# Patient Record
Sex: Male | Born: 1955 | Race: White | Hispanic: No | Marital: Married | Smoking: Never smoker
Health system: Southern US, Community
[De-identification: ages and names within clinical notes are randomized; demographics above are authoritative.]

## PROBLEM LIST (undated history)

## (undated) DIAGNOSIS — F32A Depression, unspecified: Secondary | ICD-10-CM

## (undated) DIAGNOSIS — K219 Gastro-esophageal reflux disease without esophagitis: Secondary | ICD-10-CM

## (undated) DIAGNOSIS — G20 Parkinson's disease: Secondary | ICD-10-CM

## (undated) DIAGNOSIS — G4700 Insomnia, unspecified: Secondary | ICD-10-CM

## (undated) DIAGNOSIS — I739 Peripheral vascular disease, unspecified: Secondary | ICD-10-CM

## (undated) DIAGNOSIS — N400 Enlarged prostate without lower urinary tract symptoms: Secondary | ICD-10-CM

## (undated) DIAGNOSIS — M1990 Unspecified osteoarthritis, unspecified site: Secondary | ICD-10-CM

## (undated) DIAGNOSIS — F419 Anxiety disorder, unspecified: Secondary | ICD-10-CM

## (undated) DIAGNOSIS — G4730 Sleep apnea, unspecified: Secondary | ICD-10-CM

## (undated) DIAGNOSIS — I82401 Acute embolism and thrombosis of unspecified deep veins of right lower extremity: Secondary | ICD-10-CM

## (undated) HISTORY — PX: NASAL SINUS SURGERY: SHX719

## (undated) HISTORY — PX: OTHER SURGICAL HISTORY: SHX169

---

## 2002-10-26 HISTORY — PX: OTHER SURGICAL HISTORY: SHX169

## 2007-10-27 DIAGNOSIS — I82401 Acute embolism and thrombosis of unspecified deep veins of right lower extremity: Secondary | ICD-10-CM

## 2007-10-27 HISTORY — DX: Acute embolism and thrombosis of unspecified deep veins of right lower extremity: I82.401

## 2009-10-26 HISTORY — PX: OTHER SURGICAL HISTORY: SHX169

## 2009-11-26 DIAGNOSIS — G20A1 Parkinson's disease without dyskinesia, without mention of fluctuations: Secondary | ICD-10-CM

## 2009-11-26 DIAGNOSIS — G20 Parkinson's disease: Secondary | ICD-10-CM

## 2009-11-26 HISTORY — DX: Parkinson's disease without dyskinesia, without mention of fluctuations: G20.A1

## 2009-11-26 HISTORY — DX: Parkinson's disease: G20

## 2010-08-29 ENCOUNTER — Inpatient Hospital Stay (HOSPITAL_COMMUNITY): Admission: RE | Admit: 2010-08-29 | Discharge: 2010-09-02 | Payer: Self-pay | Admitting: Orthopaedic Surgery

## 2010-10-26 HISTORY — PX: OTHER SURGICAL HISTORY: SHX169

## 2011-01-06 LAB — CBC
HCT: 22.8 % — ABNORMAL LOW (ref 39.0–52.0)
HCT: 25.8 % — ABNORMAL LOW (ref 39.0–52.0)
HCT: 44 % (ref 39.0–52.0)
Hemoglobin: 15.6 g/dL (ref 13.0–17.0)
Hemoglobin: 7.8 g/dL — ABNORMAL LOW (ref 13.0–17.0)
Hemoglobin: 8.9 g/dL — ABNORMAL LOW (ref 13.0–17.0)
MCH: 30.7 pg (ref 26.0–34.0)
MCH: 30.7 pg (ref 26.0–34.0)
MCH: 31 pg (ref 26.0–34.0)
MCH: 32 pg (ref 26.0–34.0)
MCHC: 34.2 g/dL (ref 30.0–36.0)
MCHC: 34.4 g/dL (ref 30.0–36.0)
MCHC: 34.4 g/dL (ref 30.0–36.0)
MCHC: 35.4 g/dL (ref 30.0–36.0)
MCV: 89.3 fL (ref 78.0–100.0)
MCV: 89.4 fL (ref 78.0–100.0)
MCV: 90.4 fL (ref 78.0–100.0)
MCV: 91.2 fL (ref 78.0–100.0)
Platelets: 130 10*3/uL — ABNORMAL LOW (ref 150–400)
Platelets: 173 10*3/uL (ref 150–400)
Platelets: 192 10*3/uL (ref 150–400)
Platelets: 237 10*3/uL (ref 150–400)
RBC: 2.55 MIL/uL — ABNORMAL LOW (ref 4.22–5.81)
RBC: 2.91 MIL/uL — ABNORMAL LOW (ref 4.22–5.81)
RBC: 4.87 MIL/uL (ref 4.22–5.81)
RDW: 12.4 % (ref 11.5–15.5)
RDW: 13.6 % (ref 11.5–15.5)
RDW: 13.7 % (ref 11.5–15.5)
WBC: 11.4 10*3/uL — ABNORMAL HIGH (ref 4.0–10.5)
WBC: 8.8 10*3/uL (ref 4.0–10.5)
WBC: 9.4 10*3/uL (ref 4.0–10.5)

## 2011-01-06 LAB — TYPE AND SCREEN
ABO/RH(D): O POS
Antibody Screen: NEGATIVE
Unit division: 0
Unit division: 0
Unit division: 0
Unit division: 0
Unit division: 0
Unit division: 0

## 2011-01-06 LAB — URINE MICROSCOPIC-ADD ON

## 2011-01-06 LAB — BASIC METABOLIC PANEL WITH GFR
BUN: 11 mg/dL (ref 6–23)
BUN: 12 mg/dL (ref 6–23)
BUN: 7 mg/dL (ref 6–23)
BUN: 9 mg/dL (ref 6–23)
CO2: 27 meq/L (ref 19–32)
CO2: 27 meq/L (ref 19–32)
CO2: 27 meq/L (ref 19–32)
CO2: 27 meq/L (ref 19–32)
Calcium: 7.3 mg/dL — ABNORMAL LOW (ref 8.4–10.5)
Calcium: 7.5 mg/dL — ABNORMAL LOW (ref 8.4–10.5)
Calcium: 7.6 mg/dL — ABNORMAL LOW (ref 8.4–10.5)
Calcium: 9.5 mg/dL (ref 8.4–10.5)
Chloride: 104 meq/L (ref 96–112)
Chloride: 104 meq/L (ref 96–112)
Chloride: 104 meq/L (ref 96–112)
Chloride: 106 meq/L (ref 96–112)
Creatinine, Ser: 0.84 mg/dL (ref 0.4–1.5)
Creatinine, Ser: 0.85 mg/dL (ref 0.4–1.5)
Creatinine, Ser: 0.91 mg/dL (ref 0.4–1.5)
Creatinine, Ser: 0.94 mg/dL (ref 0.4–1.5)
GFR calc non Af Amer: >60 mL/min
GFR calc non Af Amer: >60 mL/min
GFR calc non Af Amer: >60 mL/min
GFR calc non Af Amer: >60 mL/min
Glucose, Bld: 124 mg/dL — ABNORMAL HIGH (ref 70–99)
Glucose, Bld: 129 mg/dL — ABNORMAL HIGH (ref 70–99)
Glucose, Bld: 166 mg/dL — ABNORMAL HIGH (ref 70–99)
Glucose, Bld: 96 mg/dL (ref 70–99)
Potassium: 3.8 meq/L (ref 3.5–5.1)
Potassium: 3.8 meq/L (ref 3.5–5.1)
Potassium: 3.9 meq/L (ref 3.5–5.1)
Potassium: 4.7 meq/L (ref 3.5–5.1)
Sodium: 136 meq/L (ref 135–145)
Sodium: 136 meq/L (ref 135–145)
Sodium: 137 meq/L (ref 135–145)
Sodium: 141 meq/L (ref 135–145)

## 2011-01-06 LAB — SURGICAL PCR SCREEN
MRSA, PCR: NEGATIVE
Staphylococcus aureus: POSITIVE — AB

## 2011-01-06 LAB — PROTIME-INR
INR: 1.09 (ref 0.00–1.49)
INR: 1.35 (ref 0.00–1.49)
INR: 1.4 (ref 0.00–1.49)
INR: 1.73 — ABNORMAL HIGH (ref 0.00–1.49)
INR: 2.18 — ABNORMAL HIGH (ref 0.00–1.49)
Prothrombin Time: 16.9 s — ABNORMAL HIGH (ref 11.6–15.2)
Prothrombin Time: 17.4 seconds — ABNORMAL HIGH (ref 11.6–15.2)
Prothrombin Time: 20.4 seconds — ABNORMAL HIGH (ref 11.6–15.2)
Prothrombin Time: 24.4 s — ABNORMAL HIGH (ref 11.6–15.2)

## 2011-01-06 LAB — POCT I-STAT 4, (NA,K, GLUC, HGB,HCT)
Glucose, Bld: 116 mg/dL — ABNORMAL HIGH (ref 70–99)
HCT: 20 % — ABNORMAL LOW (ref 39.0–52.0)
HCT: 34 % — ABNORMAL LOW (ref 39.0–52.0)
Hemoglobin: 11.6 g/dL — ABNORMAL LOW (ref 13.0–17.0)
Hemoglobin: 6.8 g/dL — CL (ref 13.0–17.0)
Potassium: 4.3 mEq/L (ref 3.5–5.1)
Potassium: 4.5 meq/L (ref 3.5–5.1)
Sodium: 140 mEq/L (ref 135–145)
Sodium: 140 meq/L (ref 135–145)

## 2011-01-06 LAB — PREPARE RBC (CROSSMATCH)

## 2011-01-06 LAB — FERRITIN: Ferritin: 143 ng/mL (ref 22–322)

## 2011-01-06 LAB — URINALYSIS, ROUTINE W REFLEX MICROSCOPIC
Glucose, UA: NEGATIVE mg/dL
Hgb urine dipstick: NEGATIVE
Protein, ur: 30 mg/dL — AB

## 2011-01-06 LAB — IRON AND TIBC

## 2011-01-06 LAB — VITAMIN B12: Vitamin B-12: 245 pg/mL (ref 211–911)

## 2011-01-06 LAB — FOLATE: Folate: 12.6 ng/mL

## 2011-02-11 ENCOUNTER — Other Ambulatory Visit: Payer: Self-pay | Admitting: Orthopaedic Surgery

## 2011-02-11 ENCOUNTER — Encounter (HOSPITAL_COMMUNITY): Payer: PRIVATE HEALTH INSURANCE

## 2011-02-11 DIAGNOSIS — Z01812 Encounter for preprocedural laboratory examination: Secondary | ICD-10-CM | POA: Insufficient documentation

## 2011-02-11 DIAGNOSIS — G4730 Sleep apnea, unspecified: Secondary | ICD-10-CM | POA: Insufficient documentation

## 2011-02-11 DIAGNOSIS — M9420 Chondromalacia, unspecified site: Secondary | ICD-10-CM | POA: Insufficient documentation

## 2011-02-11 DIAGNOSIS — IMO0002 Reserved for concepts with insufficient information to code with codable children: Secondary | ICD-10-CM | POA: Insufficient documentation

## 2011-02-11 DIAGNOSIS — Z86718 Personal history of other venous thrombosis and embolism: Secondary | ICD-10-CM | POA: Insufficient documentation

## 2011-02-11 DIAGNOSIS — G20A1 Parkinson's disease without dyskinesia, without mention of fluctuations: Secondary | ICD-10-CM | POA: Insufficient documentation

## 2011-02-11 DIAGNOSIS — G20 Parkinson's disease: Secondary | ICD-10-CM | POA: Insufficient documentation

## 2011-02-11 DIAGNOSIS — X58XXXA Exposure to other specified factors, initial encounter: Secondary | ICD-10-CM | POA: Insufficient documentation

## 2011-02-11 LAB — CBC
Platelets: 229 10*3/uL (ref 150–400)
RBC: 5.01 MIL/uL (ref 4.22–5.81)
RDW: 13 % (ref 11.5–15.5)
WBC: 6.8 10*3/uL (ref 4.0–10.5)

## 2011-02-11 LAB — PROTIME-INR
INR: 1.08 (ref 0.00–1.49)
Prothrombin Time: 14.2 seconds (ref 11.6–15.2)

## 2011-02-11 LAB — BASIC METABOLIC PANEL
BUN: 21 mg/dL (ref 6–23)
Chloride: 107 mEq/L (ref 96–112)
GFR calc Af Amer: >60 mL/min (ref >60)
GFR calc non Af Amer: >60 mL/min (ref >60)
Potassium: 4.3 mEq/L (ref 3.5–5.1)

## 2011-02-11 LAB — SURGICAL PCR SCREEN: MRSA, PCR: NEGATIVE

## 2011-02-12 ENCOUNTER — Ambulatory Visit (HOSPITAL_COMMUNITY)
Admission: RE | Admit: 2011-02-12 | Discharge: 2011-02-12 | Disposition: A | Discharge status: Home / Self Care | Payer: PRIVATE HEALTH INSURANCE | Source: Ambulatory Visit | Attending: Orthopaedic Surgery | Admitting: Orthopaedic Surgery

## 2011-02-12 DIAGNOSIS — G4733 Obstructive sleep apnea (adult) (pediatric): Secondary | ICD-10-CM | POA: Insufficient documentation

## 2011-02-12 DIAGNOSIS — G20 Parkinson's disease: Secondary | ICD-10-CM | POA: Insufficient documentation

## 2011-02-12 DIAGNOSIS — Z86718 Personal history of other venous thrombosis and embolism: Secondary | ICD-10-CM | POA: Insufficient documentation

## 2011-02-12 DIAGNOSIS — Z96649 Presence of unspecified artificial hip joint: Secondary | ICD-10-CM | POA: Insufficient documentation

## 2011-02-12 DIAGNOSIS — M23305 Other meniscus derangements, unspecified medial meniscus, unspecified knee: Secondary | ICD-10-CM | POA: Insufficient documentation

## 2011-02-12 DIAGNOSIS — E669 Obesity, unspecified: Secondary | ICD-10-CM | POA: Insufficient documentation

## 2011-02-12 DIAGNOSIS — G20A1 Parkinson's disease without dyskinesia, without mention of fluctuations: Secondary | ICD-10-CM | POA: Insufficient documentation

## 2011-02-12 DIAGNOSIS — M9420 Chondromalacia, unspecified site: Secondary | ICD-10-CM | POA: Insufficient documentation

## 2011-02-12 DIAGNOSIS — M6750 Plica syndrome, unspecified knee: Secondary | ICD-10-CM | POA: Insufficient documentation

## 2011-02-16 NOTE — H&P (Signed)
  NAME:  Sean Tyler, Sean Tyler                 ACCOUNT NO.:  [id number]  MEDICAL RECORD NO.:  [id number]           PATIENT TYPE:  O  LOCATION:  PADM                         FACILITY:  [facility]  PHYSICIAN:  Vanita Panda. Magnus Ivan, M.D.DATE OF BIRTH:  1955/12/26  DATE OF ADMISSION:  02/11/2011 DATE OF DISCHARGE:                             HISTORY & PHYSICAL   CHIEF COMPLAINT:  Left knee pain.  HISTORY OF PRESENT ILLNESS:  Sean Tyler is a 55 year old gentleman who has had previous arthroscopic surgery at [facility] on his left knee.  He has been having worsening pain in his knee and we obtained an MRI in March 2012 and it showed retear of his residual meniscal tissue medially from the joint.  He has moderate joint effusion and significant synovial proliferation.  There is a medial plica formation as well.  Due to the continued pain he is having in his knee, we recommended he undergo arthroscopic intervention.  He wishes to proceed with this instead of a total knee replacement.  He has had a total hip replacement that went well.  PAST MEDICAL HISTORY: 1. Parkinson disease. 2. History of DVT. 3. Borderline diabetic. 4. Sleep apnea. 5. Anxiety. 6. Arthritis.  ALLERGIES: 1. CODEINE. 2. BACTRIM.  FAMILY MEDICAL HISTORY:  Lymphoma, high blood pressure.  SOCIAL HISTORY:  He is married.  He is a Investment banker, corporate.  He does not smoke and drinks alcohol on occasion.  REVIEW OF SYSTEMS:  Negative for chest pain, shortness of breath, fever, chills, nausea, and vomiting.  PHYSICAL EXAMINATION:  VITAL SIGNS:  He is afebrile with stable vital signs. GENERAL:  He is alert and oriented x3, in no acute distress or obvious discomfort. HEENT:  Normocephalic, atraumatic.  Pupils equal, round, and reactive to light.  Extraocular muscles intact. NECK:  Supple. LUNGS:  Clear to auscultation bilaterally. HEART:  Regular rate and rhythm. ABDOMEN:  Benign. EXTREMITIES:  Left knee shows a mild effusion with  medial joint line tenderness and a positive McMurray sign. NEURO:  He is neurovascularly intact.  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  PLAN:  We will proceed for an arthroscopic intervention of the left knee today.  He understands the risks and benefits of the surgery and what this will involve.     Vanita Panda. Magnus Ivan, M.D.     CYB/MEDQ  D:  02/12/2011  T:  02/12/2011  Job:  [id number]  Electronically Signed by Doneen Poisson M.D. on 02/16/2011 10:55:42 PM

## 2011-02-16 NOTE — Op Note (Signed)
NAME:  Sean Tyler, Sean Tyler                 ACCOUNT NO.:  [id number]  MEDICAL RECORD NO.:  [id number]           PATIENT TYPE:  O  LOCATION:  PADM                         FACILITY:  [facility]  PHYSICIAN:  Vanita Panda. Magnus Ivan, M.D.DATE OF BIRTH:  1956/05/06  DATE OF PROCEDURE:  02/12/2011 DATE OF DISCHARGE:                              OPERATIVE REPORT   PREOPERATIVE DIAGNOSES:  Left knee residual torn medial meniscus with medial plica and grade 3 chondromalacia medial compartment.  POSTOPERATIVE DIAGNOSES: 1. Right knee midbody medial meniscal tear. 2. Grade 4 chondromalacia medial compartment. 3. Large medial plica.  PROCEDURE:  Left knee arthroscopy with debridement medial plica excision and partial medial meniscectomy.  SURGEON:  Vanita Panda. Magnus Ivan, M.D.  ANESTHESIA: 1. General. 2. Local.  BLOOD LOSS:  Minimal.  COMPLICATIONS:  None.  INDICATION:  Mr. Tetrault is a 55 year old gentleman who had previous arthroscopic intervention of his left knee several years ago in [facility].  He has subsequently had a right total hip replacement and his left knee is really bothering him quite a bit.  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  X-rays also showed that he had still joint space remaining.  He wished to proceed with a second arthroscopic intervention given the worsening problems with his knee.  The risks and benefits of this were explained to him in detail and he did wish to proceed with surgery.  DESCRIPTION OF PROCEDURE:  After informed consent was obtained, appropriate left knee was marked.  He was brought to the operating room, placed in supine on the operating table, general anesthesia was then obtained.  His left leg was prepped and draped from the thigh  down to the ankle with DuraPrep and sterile drapes including a sterile stockinette.  The lateral leg post was utilized and the bed was raised and the knee was flexed off the table.  Time-out was called and we did identify the correct patient and correct left knee.  I then made an anterolateral arthroscopy portal just lateral to patella tendon.  A laparoscopic cannula was inserted and a large effusion was drained from the knee.  I went to the medial compartment and made a medial incision and placed an arthroscopic shaver.  I could see there was a mid body tear of the meniscus, now I was able to debride this back to a stable margin performing a partial medial meniscectomy.  We could see there were areas exposed bone directly on the tibial plateau on the medial side.  The ACL and PCL were intact.  The lateral side showed at least grade 2 chondromalacia.  The patellofemoral joint was assessed and a significant large medial plica as well arthritic changes underneath the patella and at the trochlear groove.  I used a shaver to remove the large medial plica as well.  I then debrided some cartilage from the trochlear groove as well.  All instrumentations were then removed and I allowed the fluid to drain from the knee.  I then closed the portal sites with interrupted nylon suture.  I infiltrated the knee with mixture of morphine and Marcaine as well as portal sites.  Xeroform followed by well-padded sterile dressing was applied.  The patient was awakened, extubated, and taken to recovery room  in stable condition.  All final counts were correct and there were no complications noted.     Vanita Panda. Magnus Ivan, M.D.     CYB/MEDQ  D:  02/12/2011  T:  02/12/2011  Job:  [id number]  Electronically Signed by Doneen Poisson M.D. on 02/16/2011 10:55:43 PM

## 2012-06-13 ENCOUNTER — Other Ambulatory Visit (HOSPITAL_COMMUNITY): Payer: Self-pay | Admitting: Orthopaedic Surgery

## 2012-06-14 ENCOUNTER — Encounter (HOSPITAL_COMMUNITY): Payer: Self-pay | Admitting: Pharmacy Technician

## 2012-06-14 ENCOUNTER — Encounter (HOSPITAL_COMMUNITY): Payer: Self-pay | Admitting: *Deleted

## 2012-06-16 ENCOUNTER — Encounter (HOSPITAL_COMMUNITY): Payer: Self-pay | Admitting: Certified Registered Nurse Anesthetist

## 2012-06-16 ENCOUNTER — Encounter (HOSPITAL_COMMUNITY): Payer: Self-pay | Admitting: *Deleted

## 2012-06-16 ENCOUNTER — Ambulatory Visit (HOSPITAL_COMMUNITY): Payer: BC Managed Care – PPO

## 2012-06-16 ENCOUNTER — Ambulatory Visit (HOSPITAL_COMMUNITY): Payer: BC Managed Care – PPO | Admitting: Certified Registered Nurse Anesthetist

## 2012-06-16 ENCOUNTER — Inpatient Hospital Stay (HOSPITAL_COMMUNITY)
Admission: RE | Admit: 2012-06-16 | Discharge: 2012-06-20 | DRG: 209 | Disposition: A | Discharge status: Home Health | Payer: BC Managed Care – PPO | Source: Ambulatory Visit | Attending: Orthopaedic Surgery | Admitting: Orthopaedic Surgery

## 2012-06-16 ENCOUNTER — Encounter (HOSPITAL_COMMUNITY): Payer: Self-pay

## 2012-06-16 ENCOUNTER — Encounter (HOSPITAL_COMMUNITY)
Admission: RE | Disposition: A | Discharge status: Home Health | Payer: Self-pay | Source: Ambulatory Visit | Attending: Orthopaedic Surgery

## 2012-06-16 DIAGNOSIS — Z86718 Personal history of other venous thrombosis and embolism: Secondary | ICD-10-CM

## 2012-06-16 DIAGNOSIS — G4730 Sleep apnea, unspecified: Secondary | ICD-10-CM | POA: Diagnosis present

## 2012-06-16 DIAGNOSIS — Z96649 Presence of unspecified artificial hip joint: Secondary | ICD-10-CM

## 2012-06-16 DIAGNOSIS — M1712 Unilateral primary osteoarthritis, left knee: Secondary | ICD-10-CM

## 2012-06-16 DIAGNOSIS — M1710 Unilateral primary osteoarthritis, unspecified knee: Principal | ICD-10-CM | POA: Diagnosis present

## 2012-06-16 DIAGNOSIS — K219 Gastro-esophageal reflux disease without esophagitis: Secondary | ICD-10-CM | POA: Diagnosis present

## 2012-06-16 DIAGNOSIS — G20 Parkinson's disease: Secondary | ICD-10-CM | POA: Diagnosis present

## 2012-06-16 DIAGNOSIS — G20A1 Parkinson's disease without dyskinesia, without mention of fluctuations: Secondary | ICD-10-CM | POA: Diagnosis present

## 2012-06-16 DIAGNOSIS — N400 Enlarged prostate without lower urinary tract symptoms: Secondary | ICD-10-CM | POA: Diagnosis present

## 2012-06-16 DIAGNOSIS — Z79899 Other long term (current) drug therapy: Secondary | ICD-10-CM

## 2012-06-16 HISTORY — DX: Acute embolism and thrombosis of unspecified deep veins of right lower extremity: I82.401

## 2012-06-16 HISTORY — DX: Benign prostatic hyperplasia without lower urinary tract symptoms: N40.0

## 2012-06-16 HISTORY — DX: Unspecified osteoarthritis, unspecified site: M19.90

## 2012-06-16 HISTORY — PX: TOTAL KNEE ARTHROPLASTY: SHX125

## 2012-06-16 HISTORY — DX: Gastro-esophageal reflux disease without esophagitis: K21.9

## 2012-06-16 HISTORY — DX: Parkinson's disease: G20

## 2012-06-16 HISTORY — DX: Sleep apnea, unspecified: G47.30

## 2012-06-16 HISTORY — DX: Insomnia, unspecified: G47.00

## 2012-06-16 LAB — CBC
MCH: 31.3 pg (ref 26.0–34.0)
MCV: 89 fL (ref 78.0–100.0)
Platelets: 221 10*3/uL (ref 150–400)
RDW: 12.8 % (ref 11.5–15.5)

## 2012-06-16 LAB — BASIC METABOLIC PANEL
BUN: 20 mg/dL (ref 6–23)
CO2: 24 mEq/L (ref 19–32)
Calcium: 9.1 mg/dL (ref 8.4–10.5)
Creatinine, Ser: 0.84 mg/dL (ref 0.50–1.35)
Glucose, Bld: 107 mg/dL — ABNORMAL HIGH (ref 70–99)

## 2012-06-16 LAB — PROTIME-INR: Prothrombin Time: 13.6 seconds (ref 11.6–15.2)

## 2012-06-16 LAB — URINALYSIS, ROUTINE W REFLEX MICROSCOPIC
Bilirubin Urine: NEGATIVE
Nitrite: NEGATIVE
Urobilinogen, UA: 0.2 mg/dL (ref 0.0–1.0)
pH: 5.5 (ref 5.0–8.0)

## 2012-06-16 LAB — TYPE AND SCREEN
ABO/RH(D): O POS
Antibody Screen: NEGATIVE

## 2012-06-16 SURGERY — ARTHROPLASTY, KNEE, TOTAL
Anesthesia: General | Site: Knee | Laterality: Left | Wound class: Clean

## 2012-06-16 MED ORDER — METHOCARBAMOL 500 MG PO TABS
500.0000 mg | ORAL_TABLET | Freq: Four times a day (QID) | ORAL | Status: DC | PRN
  Administered 2012-06-18 – 2012-06-19 (×5): 500 mg via ORAL
  Filled 2012-06-16 (×6): qty 1

## 2012-06-16 MED ORDER — ACETAMINOPHEN 325 MG PO TABS: 650.0000 mg | ORAL_TABLET | Freq: Four times a day (QID) | ORAL | Status: DC | PRN

## 2012-06-16 MED ORDER — MEPERIDINE HCL 50 MG/ML IJ SOLN: 6.2500 mg | INTRAMUSCULAR | Status: DC | PRN

## 2012-06-16 MED ORDER — ACETAMINOPHEN 10 MG/ML IV SOLN
INTRAVENOUS | Status: AC
  Filled 2012-06-16: qty 100

## 2012-06-16 MED ORDER — ZOLPIDEM TARTRATE 5 MG PO TABS: 5.0000 mg | ORAL_TABLET | Freq: Every evening | ORAL | Status: DC | PRN

## 2012-06-16 MED ORDER — METOCLOPRAMIDE HCL 10 MG PO TABS: 5.0000 mg | ORAL_TABLET | Freq: Three times a day (TID) | ORAL | Status: DC | PRN

## 2012-06-16 MED ORDER — ONDANSETRON HCL 4 MG/2ML IJ SOLN: 4.0000 mg | Freq: Four times a day (QID) | INTRAMUSCULAR | Status: DC | PRN

## 2012-06-16 MED ORDER — GLYCOPYRROLATE 0.2 MG/ML IJ SOLN
INTRAMUSCULAR | Status: DC | PRN
  Administered 2012-06-16: 0.6 mg via INTRAVENOUS

## 2012-06-16 MED ORDER — MENTHOL 3 MG MT LOZG
1.0000 | LOZENGE | OROMUCOSAL | Status: DC | PRN
  Filled 2012-06-16: qty 9

## 2012-06-16 MED ORDER — MIDAZOLAM HCL 5 MG/5ML IJ SOLN
INTRAMUSCULAR | Status: DC | PRN
  Administered 2012-06-16 (×2): 2 mg via INTRAVENOUS

## 2012-06-16 MED ORDER — HYDROMORPHONE HCL PF 1 MG/ML IJ SOLN
INTRAMUSCULAR | Status: AC
  Filled 2012-06-16: qty 1

## 2012-06-16 MED ORDER — LACTATED RINGERS IV SOLN
INTRAVENOUS | Status: DC | PRN
  Administered 2012-06-16 (×2): via INTRAVENOUS

## 2012-06-16 MED ORDER — ACETAMINOPHEN 650 MG RE SUPP: 650.0000 mg | Freq: Four times a day (QID) | RECTAL | Status: DC | PRN

## 2012-06-16 MED ORDER — DOCUSATE SODIUM 100 MG PO CAPS
100.0000 mg | ORAL_CAPSULE | Freq: Two times a day (BID) | ORAL | Status: DC
  Administered 2012-06-16 – 2012-06-20 (×8): 100 mg via ORAL

## 2012-06-16 MED ORDER — HYDROMORPHONE 0.3 MG/ML IV SOLN
INTRAVENOUS | Status: DC
  Administered 2012-06-16: 0.3 mg via INTRAVENOUS
  Administered 2012-06-16: 5.1 mg via INTRAVENOUS
  Administered 2012-06-17: 2.12 mg via INTRAVENOUS
  Administered 2012-06-17: 2.1 mg via INTRAVENOUS
  Administered 2012-06-17: 1.5 mg via INTRAVENOUS
  Administered 2012-06-17: 2.1 mg via INTRAVENOUS
  Administered 2012-06-17: 1.7 mg via INTRAVENOUS
  Administered 2012-06-17: 2.33 mg via INTRAVENOUS
  Administered 2012-06-17: 0.3 mg via INTRAVENOUS
  Administered 2012-06-17: 15:00:00 via INTRAVENOUS
  Administered 2012-06-18: 1.8 mg via INTRAVENOUS
  Administered 2012-06-18: 1.2 mg via INTRAVENOUS
  Administered 2012-06-18: 1.75 mg via INTRAVENOUS
  Filled 2012-06-16 (×2): qty 25

## 2012-06-16 MED ORDER — LACTATED RINGERS IV SOLN
INTRAVENOUS | Status: DC
Start: 2012-06-16 — End: 2012-06-16

## 2012-06-16 MED ORDER — ONDANSETRON HCL 4 MG/2ML IJ SOLN
INTRAMUSCULAR | Status: DC | PRN
  Administered 2012-06-16: 4 mg via INTRAVENOUS

## 2012-06-16 MED ORDER — VANCOMYCIN HCL IN DEXTROSE 1-5 GM/200ML-% IV SOLN
INTRAVENOUS | Status: AC
  Filled 2012-06-16: qty 200

## 2012-06-16 MED ORDER — CLONAZEPAM 1 MG PO TABS
1.0000 mg | ORAL_TABLET | Freq: Two times a day (BID) | ORAL | Status: DC
  Filled 2012-06-16 (×3): qty 1

## 2012-06-16 MED ORDER — SODIUM CHLORIDE 0.9 % IJ SOLN: 9.0000 mL | INTRAMUSCULAR | Status: DC | PRN

## 2012-06-16 MED ORDER — DEXTROSE 5 % IV SOLN: 3.0000 g | INTRAVENOUS | Status: DC

## 2012-06-16 MED ORDER — DIPHENHYDRAMINE HCL 12.5 MG/5ML PO ELIX: 12.5000 mg | ORAL_SOLUTION | ORAL | Status: DC | PRN

## 2012-06-16 MED ORDER — LIDOCAINE HCL (CARDIAC) 20 MG/ML IV SOLN
INTRAVENOUS | Status: DC | PRN
  Administered 2012-06-16: 100 mg via INTRAVENOUS

## 2012-06-16 MED ORDER — NEOSTIGMINE METHYLSULFATE 1 MG/ML IJ SOLN
INTRAMUSCULAR | Status: DC | PRN
  Administered 2012-06-16: 5 mg via INTRAVENOUS

## 2012-06-16 MED ORDER — PROPOFOL 10 MG/ML IV EMUL
INTRAVENOUS | Status: DC | PRN
  Administered 2012-06-16: 200 mg via INTRAVENOUS

## 2012-06-16 MED ORDER — SODIUM CHLORIDE 0.9 % IR SOLN
Status: DC | PRN
  Administered 2012-06-16: 3000 mL

## 2012-06-16 MED ORDER — NALOXONE HCL 0.4 MG/ML IJ SOLN: 0.4000 mg | INTRAMUSCULAR | Status: DC | PRN

## 2012-06-16 MED ORDER — DIPHENHYDRAMINE HCL 12.5 MG/5ML PO ELIX: 12.5000 mg | ORAL_SOLUTION | Freq: Four times a day (QID) | ORAL | Status: DC | PRN

## 2012-06-16 MED ORDER — PROMETHAZINE HCL 25 MG/ML IJ SOLN: 6.2500 mg | INTRAMUSCULAR | Status: DC | PRN

## 2012-06-16 MED ORDER — ONDANSETRON HCL 4 MG PO TABS: 4.0000 mg | ORAL_TABLET | Freq: Four times a day (QID) | ORAL | Status: DC | PRN

## 2012-06-16 MED ORDER — CARBIDOPA-LEVODOPA 25-100 MG PO TABS
2.0000 | ORAL_TABLET | Freq: Three times a day (TID) | ORAL | Status: DC
  Administered 2012-06-16 – 2012-06-18 (×8): 2 via ORAL
  Filled 2012-06-16 (×15): qty 2

## 2012-06-16 MED ORDER — SODIUM CHLORIDE 0.9 % IV SOLN
INTRAVENOUS | Status: AC
  Filled 2012-06-16: qty 100

## 2012-06-16 MED ORDER — HYDROMORPHONE HCL PF 1 MG/ML IJ SOLN
INTRAMUSCULAR | Status: DC | PRN
  Administered 2012-06-16 (×4): 1 mg via INTRAVENOUS

## 2012-06-16 MED ORDER — METOCLOPRAMIDE HCL 5 MG/ML IJ SOLN: 5.0000 mg | Freq: Three times a day (TID) | INTRAMUSCULAR | Status: DC | PRN

## 2012-06-16 MED ORDER — ROCURONIUM BROMIDE 100 MG/10ML IV SOLN
INTRAVENOUS | Status: DC | PRN
  Administered 2012-06-16: 10 mg via INTRAVENOUS
  Administered 2012-06-16: 40 mg via INTRAVENOUS

## 2012-06-16 MED ORDER — VANCOMYCIN HCL 500 MG IV SOLR
INTRAVENOUS | Status: AC
  Filled 2012-06-16: qty 500

## 2012-06-16 MED ORDER — ASPIRIN EC 325 MG PO TBEC
325.0000 mg | DELAYED_RELEASE_TABLET | Freq: Two times a day (BID) | ORAL | Status: DC
  Administered 2012-06-17 – 2012-06-20 (×6): 325 mg via ORAL
  Filled 2012-06-16 (×9): qty 1

## 2012-06-16 MED ORDER — HYDROMORPHONE HCL PF 1 MG/ML IJ SOLN: 0.2500 mg | INTRAMUSCULAR | Status: DC | PRN

## 2012-06-16 MED ORDER — SODIUM CHLORIDE 0.9 % IV SOLN
INTRAVENOUS | Status: DC
  Administered 2012-06-16 – 2012-06-17 (×3): via INTRAVENOUS

## 2012-06-16 MED ORDER — KETOROLAC TROMETHAMINE 15 MG/ML IJ SOLN
15.0000 mg | Freq: Four times a day (QID) | INTRAMUSCULAR | Status: AC
  Administered 2012-06-17 (×2): 15 mg via INTRAVENOUS
  Filled 2012-06-16 (×3): qty 1

## 2012-06-16 MED ORDER — ALUM & MAG HYDROXIDE-SIMETH 200-200-20 MG/5ML PO SUSP: 30.0000 mL | ORAL | Status: DC | PRN

## 2012-06-16 MED ORDER — DIAZEPAM 5 MG PO TABS
5.0000 mg | ORAL_TABLET | Freq: Two times a day (BID) | ORAL | Status: DC | PRN
  Administered 2012-06-17 (×2): 5 mg via ORAL
  Filled 2012-06-16 (×3): qty 1

## 2012-06-16 MED ORDER — ACETAMINOPHEN 10 MG/ML IV SOLN
INTRAVENOUS | Status: DC | PRN
  Administered 2012-06-16: 1000 mg via INTRAVENOUS

## 2012-06-16 MED ORDER — VANCOMYCIN HCL IN DEXTROSE 1-5 GM/200ML-% IV SOLN
1000.0000 mg | Freq: Two times a day (BID) | INTRAVENOUS | Status: AC
  Administered 2012-06-16: 1000 mg via INTRAVENOUS
  Filled 2012-06-16: qty 200

## 2012-06-16 MED ORDER — CEFAZOLIN SODIUM-DEXTROSE 2-3 GM-% IV SOLR
INTRAVENOUS | Status: AC
  Filled 2012-06-16: qty 50

## 2012-06-16 MED ORDER — VANCOMYCIN HCL 1000 MG IV SOLR
1500.0000 mg | INTRAVENOUS | Status: DC | PRN
  Administered 2012-06-16: 1500 mg via INTRAVENOUS

## 2012-06-16 MED ORDER — HYDROMORPHONE 0.3 MG/ML IV SOLN
INTRAVENOUS | Status: AC
  Filled 2012-06-16: qty 25

## 2012-06-16 MED ORDER — OXYMETAZOLINE HCL 0.05 % NA SOLN: 2.0000 | Freq: Two times a day (BID) | NASAL | Status: DC | PRN

## 2012-06-16 MED ORDER — SUCCINYLCHOLINE CHLORIDE 20 MG/ML IJ SOLN
INTRAMUSCULAR | Status: DC | PRN
  Administered 2012-06-16: 100 mg via INTRAVENOUS

## 2012-06-16 MED ORDER — VALACYCLOVIR HCL 500 MG PO TABS
500.0000 mg | ORAL_TABLET | Freq: Every day | ORAL | Status: DC
  Administered 2012-06-16 – 2012-06-20 (×5): 500 mg via ORAL
  Filled 2012-06-16 (×5): qty 1

## 2012-06-16 MED ORDER — PHENOL 1.4 % MT LIQD
1.0000 | OROMUCOSAL | Status: DC | PRN
  Filled 2012-06-16: qty 177

## 2012-06-16 MED ORDER — SODIUM CHLORIDE 0.9 % IR SOLN
Status: DC | PRN
  Administered 2012-06-16: 1000 mL

## 2012-06-16 MED ORDER — METHOCARBAMOL 100 MG/ML IJ SOLN
500.0000 mg | Freq: Four times a day (QID) | INTRAVENOUS | Status: DC | PRN
  Administered 2012-06-16 (×2): 500 mg via INTRAVENOUS
  Filled 2012-06-16 (×3): qty 5

## 2012-06-16 MED ORDER — MORPHINE SULFATE 4 MG/ML IJ SOLN: 4.0000 mg | INTRAMUSCULAR | Status: DC | PRN

## 2012-06-16 MED ORDER — CEFAZOLIN SODIUM-DEXTROSE 2-3 GM-% IV SOLR
2.0000 g | INTRAVENOUS | Status: AC
  Administered 2012-06-16: 2 g via INTRAVENOUS

## 2012-06-16 MED ORDER — DIPHENHYDRAMINE HCL 50 MG/ML IJ SOLN: 12.5000 mg | Freq: Four times a day (QID) | INTRAMUSCULAR | Status: DC | PRN

## 2012-06-16 MED ORDER — OXYCODONE HCL 5 MG PO TABS
5.0000 mg | ORAL_TABLET | ORAL | Status: DC | PRN
  Administered 2012-06-18 – 2012-06-20 (×10): 10 mg via ORAL
  Filled 2012-06-16 (×10): qty 2

## 2012-06-16 MED ORDER — MUPIROCIN 2 % EX OINT
TOPICAL_OINTMENT | Freq: Two times a day (BID) | CUTANEOUS | Status: DC
  Filled 2012-06-16: qty 22

## 2012-06-16 MED ORDER — FENTANYL CITRATE 0.05 MG/ML IJ SOLN
INTRAMUSCULAR | Status: DC | PRN
  Administered 2012-06-16 (×2): 50 ug via INTRAVENOUS
  Administered 2012-06-16: 100 ug via INTRAVENOUS
  Administered 2012-06-16 (×3): 50 ug via INTRAVENOUS

## 2012-06-16 SURGICAL SUPPLY — 51 items
BAG ZIPLOCK 12X15 (MISCELLANEOUS) ×2 IMPLANT
BANDAGE ELASTIC 6 VELCRO ST LF (GAUZE/BANDAGES/DRESSINGS) ×2 IMPLANT
BANDAGE ESMARK 6X9 LF (GAUZE/BANDAGES/DRESSINGS) ×1 IMPLANT
BLADE SAG 18X100X1.27 (BLADE) ×2 IMPLANT
BLADE SAW SGTL 13.0X1.19X90.0M (BLADE) ×2 IMPLANT
BNDG ESMARK 6X9 LF (GAUZE/BANDAGES/DRESSINGS) ×2
BOWL SMART MIX CTS (DISPOSABLE) ×2 IMPLANT
CEMENT HV SMART SET (Cement) ×4 IMPLANT
CLOTH BEACON ORANGE TIMEOUT ST (SAFETY) ×2 IMPLANT
CUFF TOURN SGL QUICK 34 (TOURNIQUET CUFF) ×1
CUFF TRNQT CYL 34X4X40X1 (TOURNIQUET CUFF) ×1 IMPLANT
DRAPE EXTREMITY T 121X128X90 (DRAPE) ×2 IMPLANT
DRAPE LG THREE QUARTER DISP (DRAPES) IMPLANT
DRAPE POUCH INSTRU U-SHP 10X18 (DRAPES) ×2 IMPLANT
DRAPE U-SHAPE 47X51 STRL (DRAPES) ×2 IMPLANT
DRSG PAD ABDOMINAL 8X10 ST (GAUZE/BANDAGES/DRESSINGS) ×2 IMPLANT
DURAPREP 26ML APPLICATOR (WOUND CARE) ×2 IMPLANT
ELECT REM PT RETURN 9FT ADLT (ELECTROSURGICAL) ×2
ELECTRODE REM PT RTRN 9FT ADLT (ELECTROSURGICAL) ×1 IMPLANT
EVACUATOR 1/8 PVC DRAIN (DRAIN) ×2 IMPLANT
FACESHIELD LNG OPTICON STERILE (SAFETY) ×8 IMPLANT
GAUZE XEROFORM 5X9 LF (GAUZE/BANDAGES/DRESSINGS) IMPLANT
GLOVE BIO SURGEON STRL SZ7.5 (GLOVE) ×2 IMPLANT
GLOVE BIOGEL PI IND STRL 8 (GLOVE) ×1 IMPLANT
GLOVE BIOGEL PI INDICATOR 8 (GLOVE) ×1
GOWN STRL REIN XL XLG (GOWN DISPOSABLE) ×4 IMPLANT
HANDPIECE INTERPULSE COAX TIP (DISPOSABLE) ×1
IMMOBILIZER KNEE 22 UNIV (SOFTGOODS) ×2 IMPLANT
KIT BASIN OR (CUSTOM PROCEDURE TRAY) ×2 IMPLANT
NS IRRIG 1000ML POUR BTL (IV SOLUTION) ×2 IMPLANT
PACK TOTAL JOINT (CUSTOM PROCEDURE TRAY) ×2 IMPLANT
PADDING CAST COTTON 6X4 STRL (CAST SUPPLIES) ×2 IMPLANT
POSITIONER SURGICAL ARM (MISCELLANEOUS) ×2 IMPLANT
SET HNDPC FAN SPRY TIP SCT (DISPOSABLE) ×1 IMPLANT
SET PAD KNEE POSITIONER (MISCELLANEOUS) ×2 IMPLANT
SPONGE GAUZE 4X4 12PLY (GAUZE/BANDAGES/DRESSINGS) ×2 IMPLANT
SPONGE LAP 18X18 X RAY DECT (DISPOSABLE) IMPLANT
STAPLER VISISTAT 35W (STAPLE) IMPLANT
STRIP CLOSURE SKIN 1/2X4 (GAUZE/BANDAGES/DRESSINGS) IMPLANT
SUCTION FRAZIER 12FR DISP (SUCTIONS) ×2 IMPLANT
SUT VIC AB 0 CT1 27 (SUTURE)
SUT VIC AB 0 CT1 27XBRD ANTBC (SUTURE) IMPLANT
SUT VIC AB 1 CT1 27 (SUTURE) ×3
SUT VIC AB 1 CT1 27XBRD ANTBC (SUTURE) ×3 IMPLANT
SUT VIC AB 2-0 CT1 27 (SUTURE) ×3
SUT VIC AB 2-0 CT1 TAPERPNT 27 (SUTURE) ×3 IMPLANT
SUT VLOC 180 0 24IN GS25 (SUTURE) ×2 IMPLANT
TOWEL OR 17X26 10 PK STRL BLUE (TOWEL DISPOSABLE) ×4 IMPLANT
TOWEL OR NON WOVEN STRL DISP B (DISPOSABLE) ×2 IMPLANT
WATER STERILE IRR 1500ML POUR (IV SOLUTION) ×2 IMPLANT
WRAP KNEE MAXI GEL POST OP (GAUZE/BANDAGES/DRESSINGS) ×2 IMPLANT

## 2012-06-16 NOTE — Anesthesia Postprocedure Evaluation (Signed)
  Anesthesia Post-op Note  Patient: Sean Tyler  Procedure(s) Performed: Procedure(s) (LRB): TOTAL KNEE ARTHROPLASTY (Left)  Patient Location: PACU  Anesthesia Type: General  Level of Consciousness: awake and alert   Airway and Oxygen Therapy: Patient Spontanous Breathing  Post-op Pain: mild  Post-op Assessment: Post-op Vital signs reviewed, Patient's Cardiovascular Status Stable, Respiratory Function Stable, Patent Airway and No signs of Nausea or vomiting  Post-op Vital Signs: stable  Complications: No apparent anesthesia complications

## 2012-06-16 NOTE — Progress Notes (Signed)
Utilization review completed.  

## 2012-06-16 NOTE — Preoperative (Signed)
Beta Blockers   Reason not to administer Beta Blockers:Not Applicable 

## 2012-06-16 NOTE — Anesthesia Preprocedure Evaluation (Addendum)
Anesthesia Evaluation  Patient identified by MRN, date of birth, ID band Patient awake    Reviewed: Allergy & Precautions, H&P , NPO status , Patient's Chart, lab work & pertinent test results  Airway Mallampati: II TM Distance: >3 FB Neck ROM: Full    Dental No notable dental hx.    Pulmonary neg pulmonary ROS, sleep apnea and Continuous Positive Airway Pressure Ventilation ,  breath sounds clear to auscultation  Pulmonary exam normal       Cardiovascular negative cardio ROS  Rhythm:Regular Rate:Normal     Neuro/Psych Parkinson's  negative neurological ROS  negative psych ROS   GI/Hepatic negative GI ROS, Neg liver ROS,   Endo/Other  negative endocrine ROS  Renal/GU negative Renal ROS  negative genitourinary   Musculoskeletal negative musculoskeletal ROS (+)   Abdominal   Peds negative pediatric ROS (+)  Hematology negative hematology ROS (+)   Anesthesia Other Findings   Reproductive/Obstetrics negative OB ROS                           Anesthesia Physical Anesthesia Plan  ASA: III  Anesthesia Plan: General   Post-op Pain Management:    Induction: Intravenous  Airway Management Planned: Oral ETT  Additional Equipment:   Intra-op Plan:   Post-operative Plan: Extubation in OR  Informed Consent: I have reviewed the patients History and Physical, chart, labs and discussed the procedure including the risks, benefits and alternatives for the proposed anesthesia with the patient or authorized representative who has indicated his/her understanding and acceptance.   Dental advisory given  Plan Discussed with:   Anesthesia Plan Comments:         Anesthesia Quick Evaluation

## 2012-06-16 NOTE — Brief Op Note (Signed)
06/16/2012  10:03 AM  PATIENT:  Gwynneth Munson  56 y.o. male  PRE-OPERATIVE DIAGNOSIS:  Severe osteoarthritis left knee  POST-OPERATIVE DIAGNOSIS:  Severe osteoarthritis left knee  PROCEDURE:  Procedure(s) (LRB): TOTAL KNEE ARTHROPLASTY (Left)  SURGEON:  Surgeon(s) and Role:    * Kathryne Hitch, MD - Primary  PHYSICIAN ASSISTANT:   ASSISTANTS: none   ANESTHESIA:   general  EBL:  Total I/O In: 1000 [I.V.:1000] Out: 150 [Blood:150]  BLOOD ADMINISTERED:none  DRAINS: (medium) Hemovact drain(s) in the knee joint with  Suction Open   LOCAL MEDICATIONS USED:  NONE  SPECIMEN:  No Specimen  DISPOSITION OF SPECIMEN:  N/A  COUNTS:  YES  TOURNIQUET:   Total Tourniquet Time Documented: Thigh (Left) - 97 minutes  DICTATION: .Other Dictation: Dictation Number [id number]  PLAN OF CARE: Admit to inpatient   PATIENT DISPOSITION:  PACU - hemodynamically stable.   Delay start of Pharmacological VTE agent (>24hrs) due to surgical blood loss or risk of bleeding: no

## 2012-06-16 NOTE — H&P (Signed)
Sean Tyler is an 56 y.o. male.   Chief Complaint:   Severe left knee pain HPI:   56 yo male with painful left knee OA.  Has failed steroid injections, suppliment injections, arthroscopy, rest, time and ambulating with a cane.  His x-rays show a left knee in varus with medical joint space narrowing and the scope confirmed cartilage loss.  He wishes to proceed with a left knee replacement.  The risks are blood loss, nerve and vessel injury, infection and DVT.  The goals are decreased pain and improved mobility.  Past Medical History  Diagnosis Date  . Sleep apnea     does not know cpap settings  . Insomnia   . GERD (gastroesophageal reflux disease)     occasional   . Arthritis   . Deep vein blood clot of right lower extremity 2009  . Enlarged prostate     trouble with urine stream  . Parkinson's disease feb 2011    dr haq at [facility]    Past Surgical History  Procedure Date  . Left knee arthroscopy 2011  . Right total hip replacment     nov 2011  . Left knee arthroscopy 2012  . Nasal sinus surgery several yrs ago  . Benign tumor removed behind right ear 2004    History reviewed. No pertinent family history. Social History:  reports that he has never smoked. He has never used smokeless tobacco. He reports that he drinks about 4.2 ounces of alcohol per week. He reports that he does not use illicit drugs.  Allergies:  Allergies  Allergen Reactions  . Codeine Nausea And Vomiting    Medications Prior to Admission  Medication Sig Dispense Refill  . calcium carbonate (TITRALAC) 420 MG CHEW Chew 420 mg by mouth as needed.      . carbidopa-levodopa (SINEMET IR) 25-100 MG per tablet Take 2 tablets by mouth See admin instructions. Three to four times daily      . clonazePAM (KLONOPIN) 1 MG tablet Take 1 mg by mouth 2 (two) times daily as needed. For sleep or tremors      . diazepam (VALIUM) 5 MG tablet Take 5 mg by mouth every 12 (twelve) hours as needed. For anxiety      .  nabumetone (RELAFEN) 750 MG tablet Take 750 mg by mouth daily.      Marland Kitchen OVER THE COUNTER MEDICATION Super beta prostate  1tab daily      . oxyCODONE-acetaminophen (PERCOCET/ROXICET) 5-325 MG per tablet Take 0.25-0.5 tablets by mouth at bedtime as needed. For pain      . oxymetazoline (AFRIN) 0.05 % nasal spray Place 2 sprays into the nose 2 (two) times daily as needed.      . triamcinolone (NASACORT) 55 MCG/ACT nasal inhaler 2 sprays by Nasogastric route daily. In right nostril      . valACYclovir (VALTREX) 1000 MG tablet Take 500 mg by mouth daily.        Results for orders placed during the hospital encounter of 06/16/12 (from the past 48 hour(s))  URINALYSIS, ROUTINE W REFLEX MICROSCOPIC     Status: Abnormal   Collection Time   06/16/12  6:20 AM      Component Value Range Comment   Color, Urine YELLOW  YELLOW    APPearance CLEAR  CLEAR    Specific Gravity, Urine 1.032 (*) 1.005 - 1.030    pH 5.5  5.0 - 8.0    Glucose, UA NEGATIVE  NEGATIVE mg/dL  Hgb urine dipstick NEGATIVE  NEGATIVE    Bilirubin Urine NEGATIVE  NEGATIVE    Ketones, ur TRACE (*) NEGATIVE mg/dL    Protein, ur NEGATIVE  NEGATIVE mg/dL    Urobilinogen, UA 0.2  0.0 - 1.0 mg/dL    Nitrite NEGATIVE  NEGATIVE    Leukocytes, UA NEGATIVE  NEGATIVE MICROSCOPIC NOT DONE ON URINES WITH NEGATIVE PROTEIN, BLOOD, LEUKOCYTES, NITRITE, OR GLUCOSE <1000 mg/dL.  BASIC METABOLIC PANEL     Status: Abnormal   Collection Time   06/16/12  6:30 AM      Component Value Range Comment   Sodium 137  135 - 145 mEq/L    Potassium 3.6  3.5 - 5.1 mEq/L    Chloride 103  96 - 112 mEq/L    CO2 24  19 - 32 mEq/L    Glucose, Bld 107 (*) 70 - 99 mg/dL    BUN 20  6 - 23 mg/dL    Creatinine, Ser 1.61  0.50 - 1.35 mg/dL    Calcium 9.1  8.4 - 09.6 mg/dL    GFR calc non Af Amer >90  >90 mL/min    GFR calc Af Amer >90  >90 mL/min   CBC     Status: Normal   Collection Time   06/16/12  6:30 AM      Component Value Range Comment   WBC 8.1  4.0 - 10.5  K/uL    RBC 4.80  4.22 - 5.81 MIL/uL    Hemoglobin 15.0  13.0 - 17.0 g/dL    HCT 04.5  40.9 - 81.1 %    MCV 89.0  78.0 - 100.0 fL    MCH 31.3  26.0 - 34.0 pg    MCHC 35.1  30.0 - 36.0 g/dL    RDW 91.4  78.2 - 95.6 %    Platelets 221  150 - 400 K/uL   PROTIME-INR     Status: Normal   Collection Time   06/16/12  6:30 AM      Component Value Range Comment   Prothrombin Time 13.6  11.6 - 15.2 seconds    INR 1.02  0.00 - 1.49   APTT     Status: Normal   Collection Time   06/16/12  6:30 AM      Component Value Range Comment   aPTT 33  24 - 37 seconds   TYPE AND SCREEN     Status: Normal   Collection Time   06/16/12  6:30 AM      Component Value Range Comment   ABO/RH(D) O POS      Antibody Screen NEG      Sample Expiration 06/19/2012      No results found.  Review of Systems  All other systems reviewed and are negative.    Blood pressure 105/79, pulse 71, temperature 98.3 F (36.8 C), resp. rate 20, height 6\' 4"  (1.93 m), weight 112.492 kg (248 lb), SpO2 99.00%. Physical Exam  Constitutional: He is oriented to person, place, and time. He appears well-developed and well-nourished.  HENT:  Head: Normocephalic and atraumatic.  Eyes: Pupils are equal, round, and reactive to light.  Neck: Normal range of motion. Neck supple.  Cardiovascular: Normal rate and regular rhythm.   Respiratory: Effort normal and breath sounds normal.  GI: Soft. Bowel sounds are normal.  Musculoskeletal:       Left knee: He exhibits swelling and abnormal alignment. tenderness found. Medial joint line and lateral joint line tenderness noted.  Neurological: He is alert and oriented to person, place, and time.  Skin: Skin is warm and dry.  Psychiatric: He has a normal mood and affect.     Assessment/Plan Left knee with painful osteoarthritis 1) to the OR today for a left total knee replacement.  Ahamed Hofland Y 06/16/2012, 7:20 AM

## 2012-06-16 NOTE — Progress Notes (Signed)
CPAP setup for pt to use at night. Placed on 8 cmH2O because 10 cmH2O felt too strong. Pt has his home circuit/mask with 2L O2 bled in. Pt took off as I was leaving because his wife called from [place]. He can place back on himself when ready. RN aware of all this & told to call me if he has any problems.  Jacqulynn Cadet RRT

## 2012-06-16 NOTE — Transfer of Care (Signed)
Immediate Anesthesia Transfer of Care Note  Patient: Sean Tyler  Procedure(s) Performed: Procedure(s) (LRB): TOTAL KNEE ARTHROPLASTY (Left)  Patient Location: PACU  Anesthesia Type: General  Level of Consciousness: sedated, patient cooperative and responds to stimulaton  Airway & Oxygen Therapy: Patient Spontanous Breathing and Patient connected to face mask oxgen  Post-op Assessment: Report given to PACU RN and Post -op Vital signs reviewed and stable  Post vital signs: Reviewed and stable  Complications: No apparent anesthesia complications

## 2012-06-17 ENCOUNTER — Encounter (HOSPITAL_COMMUNITY): Payer: Self-pay | Admitting: Orthopaedic Surgery

## 2012-06-17 LAB — BASIC METABOLIC PANEL
Chloride: 97 mEq/L (ref 96–112)
GFR calc Af Amer: >90 mL/min (ref >90)
GFR calc non Af Amer: >90 mL/min (ref >90)
Potassium: 3.6 mEq/L (ref 3.5–5.1)
Sodium: 131 mEq/L — ABNORMAL LOW (ref 135–145)

## 2012-06-17 LAB — CBC
Hemoglobin: 12.4 g/dL — ABNORMAL LOW (ref 13.0–17.0)
MCHC: 34.5 g/dL (ref 30.0–36.0)
RDW: 12.8 % (ref 11.5–15.5)
WBC: 9.3 10*3/uL (ref 4.0–10.5)

## 2012-06-17 MED ORDER — CARBIDOPA-LEVODOPA CR 25-100 MG PO TBCR: 2.0000 | EXTENDED_RELEASE_TABLET | Freq: Once | ORAL | Status: DC

## 2012-06-17 MED ORDER — CARBIDOPA-LEVODOPA 25-100 MG PO TABS
2.0000 | ORAL_TABLET | Freq: Once | ORAL | Status: AC
Start: 2012-06-17 — End: 2012-06-18
  Administered 2012-06-18: 2 via ORAL
  Filled 2012-06-17: qty 2

## 2012-06-17 NOTE — Evaluation (Signed)
Physical Therapy Evaluation Patient Details Name: Sean Tyler MRN: [id number] DOB: October 25, 1956 Today's Date: 06/17/2012 Time: 4098-1191 PT Time Calculation (min): 65 min  PT Assessment / Plan / Recommendation Clinical Impression  Pt with L TKR presents with decreased L LE strength/ROM limiting functional mobility    PT Assessment  Patient needs continued PT services    Follow Up Recommendations  Home health PT    Barriers to Discharge        Equipment Recommendations  None recommended by PT    Recommendations for Other Services OT consult   Frequency 7X/week    Precautions / Restrictions Precautions Precautions: Knee Required Braces or Orthoses: Knee Immobilizer - Left Knee Immobilizer - Left: Discontinue once straight leg raise with < 10 degree lag Restrictions Weight Bearing Restrictions: No Other Position/Activity Restrictions: WBAT   Pertinent Vitals/Pain 5/10 with activity; PCA utilized, ice packs provided      Mobility  Bed Mobility Bed Mobility: Supine to Sit Supine to Sit: 4: Min assist Details for Bed Mobility Assistance: cues for sequence, min assist with L LE Transfers Transfers: Sit to Stand;Stand to Sit Sit to Stand: 4: Min assist Stand to Sit: 4: Min assist Details for Transfer Assistance: cues for use of UEs and for LE management Ambulation/Gait Ambulation/Gait Assistance: 4: Min assist Ambulation Distance (Feet): 68 Feet Assistive device: Rolling walker Ambulation/Gait Assistance Details: cues for sequence, position from RW and posture Gait Pattern: Step-to pattern    Exercises Total Joint Exercises Ankle Circles/Pumps: AROM;10 reps;Supine;Both Quad Sets: AROM;10 reps;Both;Supine Heel Slides: AAROM;10 reps;Supine;Left Straight Leg Raises: AAROM;10 reps;Supine;Left   PT Diagnosis: Difficulty walking  PT Problem List: Decreased strength;Decreased range of motion;Decreased activity tolerance;Decreased mobility;Decreased knowledge of use of  DME;Pain PT Treatment Interventions:     PT Goals Acute Rehab PT Goals PT Goal Formulation: With patient Time For Goal Achievement: 06/22/12 Potential to Achieve Goals: Good Pt will go Supine/Side to Sit: with supervision PT Goal: Supine/Side to Sit - Progress: Goal set today Pt will go Sit to Supine/Side: with supervision PT Goal: Sit to Supine/Side - Progress: Goal set today Pt will go Sit to Stand: with supervision PT Goal: Sit to Stand - Progress: Goal set today Pt will go Stand to Sit: with supervision PT Goal: Stand to Sit - Progress: Goal set today Pt will Ambulate: 51 - 150 feet;with supervision;with rolling walker PT Goal: Ambulate - Progress: Goal set today Pt will Go Up / Down Stairs: 3-5 stairs;with min assist;with least restrictive assistive device PT Goal: Up/Down Stairs - Progress: Goal set today  Visit Information  Last PT Received On: 06/17/12 Assistance Needed: +1    Subjective Data  Subjective: Pt extremely talkative on broad range of topics Patient Stated Goal: Get the other knee done and move to [place]    Prior Functioning  Home Living Lives With: Alone (will stay with parents temporarily) Available Help at Discharge: Family Type of Home: House Home Access: Stairs to enter Secretary/administrator of Steps: 4 Entrance Stairs-Rails: Right Home Layout: Two level Alternate Level Stairs-Number of Steps: 2 Alternate Level Stairs-Rails: None Home Adaptive Equipment: Crutches;Walker - rolling Prior Function Level of Independence: Independent Able to Take Stairs?: Yes Driving: Yes Communication Communication: No difficulties    Cognition  Overall Cognitive Status: Appears within functional limits for tasks assessed/performed Arousal/Alertness: Awake/alert Orientation Level: Appears intact for tasks assessed Behavior During Session: [facility] for tasks performed    Extremity/Trunk Assessment Right Upper Extremity Assessment RUE ROM/Strength/Tone: [facility] for  tasks assessed Left  Upper Extremity Assessment LUE ROM/Strength/Tone: WFL for tasks assessed Right Lower Extremity Assessment RLE ROM/Strength/Tone: [facility] for tasks assessed Left Lower Extremity Assessment LLE ROM/Strength/Tone: Deficits LLE ROM/Strength/Tone Deficits: 2-/5 quads, aarom at knee -15 - 40   Balance    End of Session PT - End of Session Equipment Utilized During Treatment: Left knee immobilizer Activity Tolerance: Patient tolerated treatment well Patient left: in chair;with call bell/phone within reach;with family/visitor present Nurse Communication: Mobility status CPM Left Knee CPM Left Knee: Off  GP     Dalia Jollie 06/17/2012, 12:33 PM

## 2012-06-17 NOTE — Progress Notes (Signed)
Physical Therapy Treatment Patient Details Name: Sean Tyler MRN: [id number] DOB: [birth year] Today's Date: 06/17/2012 Time: 4098-1191 PT Time Calculation (min): 28 min  PT Assessment / Plan / Recommendation Comments on Treatment Session       Follow Up Recommendations  Home health PT    Barriers to Discharge        Equipment Recommendations  None recommended by PT    Recommendations for Other Services OT consult  Frequency 7X/week   Plan Discharge plan remains appropriate    Precautions / Restrictions Precautions Precautions: Knee Required Braces or Orthoses: Knee Immobilizer - Left Knee Immobilizer - Left: Discontinue once straight leg raise with < 10 degree lag Restrictions Weight Bearing Restrictions: No Other Position/Activity Restrictions: WBAT   Pertinent Vitals/Pain 7/10, PCA used, cold pack applied    Mobility  Bed Mobility Bed Mobility:  (Pt requests to remain in chair until after his lunch arrives) Supine to Sit: 4: Min assist Details for Bed Mobility Assistance: cues for sequence, min assist with L LE Transfers Transfers: Sit to Stand;Stand to Sit Sit to Stand: 4: Min assist;3: Mod assist Stand to Sit: 4: Min assist Details for Transfer Assistance: on rising, pt initially unsteady in fwd direction  Ambulation/Gait Ambulation/Gait Assistance: 1: +2 Total assist (+2 for saftey - pt unsteady 2* MEDs?) Ambulation/Gait: Patient Percentage: 80% Ambulation Distance (Feet): 121 Feet Assistive device: Rolling walker Ambulation/Gait Assistance Details: cues for sequence, posture, and position from RW Gait Pattern: Step-to pattern    Exercises Total Joint Exercises Ankle Circles/Pumps: AROM;10 reps;Supine;Both Quad Sets: AROM;10 reps;Both;Supine Heel Slides: AAROM;10 reps;Supine;Left Straight Leg Raises: AAROM;10 reps;Supine;Left   PT Diagnosis: Difficulty walking  PT Problem List: Decreased strength;Decreased range of motion;Decreased activity  tolerance;Decreased mobility;Decreased knowledge of use of DME;Pain PT Treatment Interventions:     PT Goals Acute Rehab PT Goals PT Goal Formulation: With patient Time For Goal Achievement: 06/22/12 Potential to Achieve Goals: Good Pt will go Supine/Side to Sit: with supervision PT Goal: Supine/Side to Sit - Progress: Goal set today Pt will go Sit to Supine/Side: with supervision PT Goal: Sit to Supine/Side - Progress: Goal set today Pt will go Sit to Stand: with supervision PT Goal: Sit to Stand - Progress: Progressing toward goal Pt will go Stand to Sit: with supervision PT Goal: Stand to Sit - Progress: Progressing toward goal Pt will Ambulate: 51 - 150 feet;with supervision;with rolling walker PT Goal: Ambulate - Progress: Progressing toward goal Pt will Go Up / Down Stairs: 3-5 stairs;with min assist;with least restrictive assistive device PT Goal: Up/Down Stairs - Progress: Goal set today  Visit Information  Last PT Received On: 06/17/12 Assistance Needed: +2 (Pt unsteady 2* PCA?)    Subjective Data  Subjective: Are we going to walk again? Patient Stated Goal: Get the other knee done and move to [place]    Cognition  Overall Cognitive Status: Appears within functional limits for tasks assessed/performed Arousal/Alertness: Awake/alert Orientation Level: Appears intact for tasks assessed Behavior During Session: [facility] for tasks performed    Balance     End of Session PT - End of Session Equipment Utilized During Treatment: Left knee immobilizer Activity Tolerance: Patient tolerated treatment well Patient left: in chair;with call bell/phone within reach;with family/visitor present Nurse Communication: Mobility status   GP     Mubarak Bevens 06/17/2012, 1:56 PM

## 2012-06-17 NOTE — Op Note (Signed)
NAMEJAMYRON, REDD NO.:  [id number]  MEDICAL RECORD NO.:  [id number]  LOCATION:  1621                         FACILITY:  [facility]  PHYSICIAN:  Vanita Panda. Magnus Ivan, M.D.DATE OF BIRTH:  1956/03/20  DATE OF PROCEDURE:  06/16/2012 DATE OF DISCHARGE:                              OPERATIVE REPORT   PREOPERATIVE DIAGNOSIS:  End-stage arthritis and degenerative joint disease, left knee.  POSTOPERATIVE DIAGNOSIS:  End-stage arthritis and degenerative joint disease, left knee.  PROCEDURE:  Left total knee arthroplasty.  IMPLANTS:  [employer] Triathlon knee with size 6 femur, size 6 tibial tray, 11-mm polyethylene insert, 38-mm patellar button.  SURGEON:  Vanita Panda. Magnus Ivan, M.D.  ANESTHESIA:  General.  ANTIBIOTICS:  1 g IV vancomycin and IV Ancef.  TOURNIQUET TIME:  96 minutes.  BLOOD LOSS:  100-150 mL.  COMPLICATIONS:  None.  INDICATIONS:  Mr. Sean Tyler is a 56 year old gentleman with debilitating arthritis involving his left knee.  It has gotten to where it affects his activities of daily living.  We performed a arthroscopic intervention, multiple steroid injections, supplement injections.  She ambulates with a cane and this is not helping him at all.  He has Parkinson disease as well and his pain is extenuated and he is on chronic pain medications, and at this point wishes to proceed with a total knee arthroplasty.  Given the pain he is having, his x-rays also show significant end-stage arthritis.  Arthroscopy pictures showed cartilage loss on the medial compartment mainly on the patellofemoral joint.  The risks and benefits were explained to him and talked to him in detail about this.  He does wish to proceed with the surgery.  PROCEDURE:  After informed consent was obtained, appropriate left knee was marked.  He was brought to the operating room, placed supine on the operating table.  General anesthesia was obtained.  Nonsterile tourniquet was placed  on his upper left thigh and his left leg was prepped and draped from the thigh down to the ankle with DuraPrep and sterile drapes including sterile stockinette.  A time-out was called and he was identified as the correct patient, correct left knee.  I then used an Esmarch to wrap out the leg and tourniquet was inflated to 350 mm pressure.  I made a midline incision directly over the patella and carried this down to the knee joint.  I performed a medial parapatellar arthrotomy, and then was able to invert the patella.  I released the lateral soft tissues, and then removed osteophytes from the knee.  I removed remnants of the medial and lateral meniscus as well the PCL and ACL.  I then made my tibial cut referencing off the high side cutting about 9 mm with taking into account the 0 slope and watching my varus and valgus alignment.  Once this was done, I then turned attention to the femur.  I set my femoral cutting guide and made my distal femoral cut at about 10 mm.  Using a size 9 extension block, I was able to assess the knee in full extension, and he had full extension with this and after releasing medial tissue was balanced in extension.  I then  chose a size 6 femur after referencing off the epicondylar axis and the posterior condyles with a size 6, 4-in-1 cutting block and made my anterior and posterior cuts as well as my chamfer cuts.  I then made my femoral box cut and turned attention back to the tibia.  I trialed a size 6 tibia tray with a 9-mm polyethylene insert.  I then made my cuts from my tibial tray and with all trial instruments and components in place, put the knee through range of motion.  I was very satisfied with this.  I then made my patellar cut and drilled lugs for a 38 patella.  I then removed all trial instrumentation, copiously irrigated the knee with normal saline solution using pulsatile lavage.  I then cemented the real size 6 tibial tray followed by the real 6  femur.  I trialed a 9-mm polyethylene insert were cemented to the patellar button as well.  Once the cement had dried, I trialed a 9 mm and followed x 11 mm polyethylene insert, and I did feel the 11 x 1 mm insert again with more stability, still was full extension to 120 degrees flexion.  I then placed the real 11 poly insert.  I let the tourniquet down.  Hemostasis was obtained electrocautery.  I placed a medium Hemovac drain and closed the arthrotomy with interrupted #1 Vicryl suture followed x 0 V-Loc suture. I closed the subcutaneous tissue with 2-0 Vicryl and staples on the skin.  Xeroform followed well-padded sterile dressings applied and he was awakened, extubated, and taken to recovery room in stable condition. All final counts correct.  There were no complications noted.     Vanita Panda. Magnus Ivan, M.D.     CYB/MEDQ  D:  06/16/2012  T:  06/17/2012  Job:  [id number]

## 2012-06-17 NOTE — Progress Notes (Signed)
Orthopedic Tech Progress Note Patient Details:  Sean Tyler [birth year] [id number]  Patient ID: Sean Tyler, male   DOB: 27-Nov-1955, 56 y.o.   MRN: [id number]   Sean Tyler 06/17/2012, 7:38 AM Pt has knee immobilizer

## 2012-06-17 NOTE — Care Management Note (Addendum)
    Page 1 of 2   06/20/2012     10:14:34 AM   CARE MANAGEMENT NOTE 06/20/2012  Patient:  Sean Tyler,Sean Tyler   Account Number:  [id number]  Date Initiated:  06/17/2012  Documentation initiated by:  Colleen Can  Subjective/Objective Assessment:   dx severe osteoarthritis left kne; total knee replacemnt on day of admission     Action/Plan:   CM spoke with patient. Plans are for patient to go to his parents's home in [place] where he will recuperate. Has crutches and access to RW.   Anticipated DC Date:  06/19/2012   Anticipated DC Plan:  HOME W HOME HEALTH SERVICES  In-house referral  NA      DC Planning Services  CM consult      [facility] Choice  HOME HEALTH   Choice offered to / List presented to:  C-1 Patient   DME arranged  NA      DME agency  NA     HH arranged  HH-2 PT      [facility] agency  [facility]   Status of service:  Completed, signed off Medicare Important Message given?  NO (If response is "NO", the following Medicare IM given date fields will be blank) Date Medicare IM given:   Date Additional Medicare IM given:    Discharge Disposition:  HOME W HOME HEALTH SERVICES  Per UR Regulation:  Reviewed for med. necessity/level of care/duration of stay  If discussed at Long Length of Stay Meetings, dates discussed:    Comments:  06/17/2012 Raynelle Bring BSN CCM [phone] [facility] called and can provide HHt for pt. HHorders, face sheet, op note, H&P sent to fax [phone] with confirmation. Address of caregivers also included with faxed information. [address] and [address], [address], [place], [place] [address] [phone].

## 2012-06-18 LAB — CBC
Hemoglobin: 11.5 g/dL — ABNORMAL LOW (ref 13.0–17.0)
RBC: 3.7 MIL/uL — ABNORMAL LOW (ref 4.22–5.81)
WBC: 11.1 10*3/uL — ABNORMAL HIGH (ref 4.0–10.5)

## 2012-06-18 NOTE — Progress Notes (Signed)
Subjective: 2 Days Post-Op Procedure(s) (LRB): TOTAL KNEE ARTHROPLASTY (Left) Patient reports pain as moderate.    Objective: Vital signs in last 24 hours: Temp:  [99.4 F (37.4 C)-100.2 F (37.9 C)] 100.1 F (37.8 C) (08/24 0539) Pulse Rate:  [91-102] 102  (08/24 0539) Resp:  [16-26] 24  (08/24 0539) BP: (111-122)/(67-71) 111/67 mmHg (08/24 0539) SpO2:  [90 %-95 %] 90 % (08/24 0539) FiO2 (%):  [95 %] 95 % (08/23 1200)  Intake/Output from previous day: 08/23 0701 - 08/24 0700 In: 2430 [P.O.:620; I.V.:1810] Out: 2350 [Urine:2350] Intake/Output this shift: Total I/O In: -  Out: 850 [Urine:850]   Basename 06/18/12 0515 06/17/12 0345 06/16/12 0630  HGB 11.5* 12.4* 15.0    Basename 06/18/12 0515 06/17/12 0345  WBC 11.1* 9.3  RBC 3.70* 3.97*  HCT 33.7* 35.9*  PLT 164 167    Basename 06/17/12 0345 06/16/12 0630  NA 131* 137  K 3.6 3.6  CL 97 103  CO2 25 24  BUN 14 20  CREATININE 0.83 0.84  GLUCOSE 136* 107*  CALCIUM 8.0* 9.1    Basename 06/16/12 0630  LABPT --  INR 1.02    Sensation intact distally Intact pulses distally Dorsiflexion/Plantar flexion intact Incision: scant drainage Compartment soft  Assessment/Plan: 2 Days Post-Op Procedure(s) (LRB): TOTAL KNEE ARTHROPLASTY (Left) Up with therapy D/C IV fluids Discharge home with home health likely Monday.  Kathryne Hitch 06/18/2012, 10:47 AM

## 2012-06-18 NOTE — Progress Notes (Signed)
06/18/12 1544  PT Visit Information  Last PT Received On 06/18/12  Assistance Needed +1  PT Time Calculation  PT Start Time 1515  PT Stop Time 1546  PT Time Calculation (min) 31 min  Subjective Data  Subjective I am very fatigued and groggy this afternoon  Precautions  Precautions Knee  Required Braces or Orthoses Knee Immobilizer - Left  Knee Immobilizer - Left Discontinue once straight leg raise with < 10 degree lag  Restrictions  Other Position/Activity Restrictions WBAT  Cognition  Overall Cognitive Status Appears within functional limits for tasks assessed/performed  Arousal/Alertness Awake/alert  Orientation Level Appears intact for tasks assessed  Behavior During Session [facility] for tasks performed  Bed Mobility  Sit to Supine 4: Min assist (with LE)  Transfers  Transfers Sit to Stand;Stand to Sit  Sit to Stand 4: Min guard  Stand to Sit 4: Min guard  Ambulation/Gait  Ambulation/Gait Assistance 4: Min guard (10x2 around in room to/from bathroom )  Ambulation Distance (Feet) 10 Feet  General Gait Details limited ambulation this afternoon to focus on knee ROm and exercies, and also pt very fatigued this afternoon and had been up and down for bathroom several times  Exercises  Exercises Total Joint  Total Joint Exercises  Ankle Circles/Pumps AROM;Left;10 reps;Supine  Quad Sets AROM;Left;10 reps;Supine  Heel Slides AAROM;Left;10 reps;Supine  Straight Leg Raises AAROM;Left;10 reps;Supine  Short Arc Quad AAROM;Left;10 reps;Supine  PT - End of Session  Equipment Utilized During Treatment Left knee immobilizer  PT - Assessment/Plan  Comments on Treatment Session Pt seemed to be very fatigued  this afternoo and limited his ambulationa dn focused on exercises due to pt very stiff and with pain with little mobility in the knee  PT Plan Discharge plan remains appropriate  PT Frequency 7X/week  Recommendations for Other Services OT consult  Follow Up Recommendations Home health  PT  Equipment Recommended None recommended by PT  Acute Rehab PT Goals  PT Goal: Supine/Side to Sit - Progress Progressing toward goal  PT Goal: Sit to Supine/Side - Progress Progressing toward goal  PT Goal: Sit to Stand - Progress Progressing toward goal  PT Goal: Stand to Sit - Progress Progressing toward goal  PT Goal: Ambulate - Progress Progressing toward goal  PT General Charges  $$ ACUTE PT VISIT 1 Procedure  PT Treatments  $Therapeutic Exercise 23-37 mins   Marella Bile, PT Pager: [phone] 06/18/2012

## 2012-06-19 LAB — CBC
HCT: 31.5 % — ABNORMAL LOW (ref 39.0–52.0)
Hemoglobin: 10.9 g/dL — ABNORMAL LOW (ref 13.0–17.0)
MCV: 90.3 fL (ref 78.0–100.0)
Platelets: 168 10*3/uL (ref 150–400)
RBC: 3.49 MIL/uL — ABNORMAL LOW (ref 4.22–5.81)
WBC: 10.4 10*3/uL (ref 4.0–10.5)

## 2012-06-19 MED ORDER — BISACODYL 10 MG RE SUPP
10.0000 mg | Freq: Every day | RECTAL | Status: DC | PRN
  Administered 2012-06-19: 10 mg via RECTAL
  Filled 2012-06-19: qty 1

## 2012-06-19 MED ORDER — BISACODYL 5 MG PO TBEC
5.0000 mg | DELAYED_RELEASE_TABLET | Freq: Every day | ORAL | Status: DC | PRN
  Administered 2012-06-19: 10 mg via ORAL

## 2012-06-19 NOTE — Progress Notes (Signed)
Physical Therapy Treatment Patient Details Name: Sean Tyler MRN: [id number] DOB: Mar 27, 1956 Today's Date: 06/19/2012 Time: 1191-4782 PT Time Calculation (min): 37 min  PT Assessment / Plan / Recommendation Comments on Treatment Session  Pt doing well with stair training.  Also increased ambulation distance.     Follow Up Recommendations  Home health PT    Barriers to Discharge        Equipment Recommendations  None recommended by PT;None recommended by OT    Recommendations for Other Services    Frequency 7X/week   Plan Discharge plan remains appropriate    Precautions / Restrictions Precautions Precautions: Knee Required Braces or Orthoses: Knee Immobilizer - Left Knee Immobilizer - Left: Discontinue once straight leg raise with < 10 degree lag Restrictions Weight Bearing Restrictions: No Other Position/Activity Restrictions: WBAT   Pertinent Vitals/Pain 8/10    Mobility  Bed Mobility Bed Mobility: Supine to Sit;Sit to Supine Supine to Sit: 4: Min guard;HOB flat Sit to Supine: 4: Min guard;HOB flat Details for Bed Mobility Assistance: Min/guard assist for LLE into and out of bed with cues for hand placement on bed instead of trapeze bar to better simulate home environment.  Transfers Transfers: Sit to Stand;Stand to Sit Sit to Stand: 5: Supervision;With upper extremity assist;From bed Stand to Sit: 5: Supervision;To bed Details for Transfer Assistance: Supervision for safety with min cues for hand placement.  Ambulation/Gait Ambulation/Gait Assistance: 4: Min guard Ambulation Distance (Feet): 220 Feet Assistive device: Rolling walker Ambulation/Gait Assistance Details: Min cues for not stepping too far into RW.  Gait Pattern: Step-to pattern Stairs: Yes Stairs Assistance: 4: Min guard Stair Management Technique: No rails;Step to pattern;Backwards;Forwards;With walker Number of Stairs: 2     Exercises Total Joint Exercises Ankle Circles/Pumps: AROM;10  reps;Both Quad Sets: AROM;Left;10 reps;Supine Heel Slides: AAROM;Left;10 reps;Supine Hip ABduction/ADduction: AAROM;Left;10 reps Straight Leg Raises: AAROM;Left;10 reps;Supine   PT Diagnosis:    PT Problem List:   PT Treatment Interventions:     PT Goals Acute Rehab PT Goals PT Goal Formulation: With patient Time For Goal Achievement: 06/22/12 Potential to Achieve Goals: Good Pt will go Supine/Side to Sit: with supervision PT Goal: Supine/Side to Sit - Progress: Progressing toward goal Pt will go Sit to Supine/Side: with supervision PT Goal: Sit to Supine/Side - Progress: Progressing toward goal Pt will go Sit to Stand: with supervision PT Goal: Sit to Stand - Progress: Met Pt will go Stand to Sit: with supervision PT Goal: Stand to Sit - Progress: Met Pt will Ambulate: 51 - 150 feet;with supervision;with rolling walker PT Goal: Ambulate - Progress: Progressing toward goal Pt will Go Up / Down Stairs: 3-5 stairs;with min assist;with least restrictive assistive device PT Goal: Up/Down Stairs - Progress: Progressing toward goal  Visit Information  Last PT Received On: 06/19/12 Assistance Needed: +1    Subjective Data  Subjective: I'll practice stairs.  Patient Stated Goal: Get the other knee done and move to [place]    Cognition  Overall Cognitive Status: Appears within functional limits for tasks assessed/performed Arousal/Alertness: Awake/alert Orientation Level: Appears intact for tasks assessed Behavior During Session: [facility] for tasks performed    Balance     End of Session PT - End of Session Equipment Utilized During Treatment: Left knee immobilizer Activity Tolerance: Patient limited by pain Patient left: in bed;with call bell/phone within reach Nurse Communication: Mobility status   GP     Page, [name] 06/19/2012, 3:37 PM

## 2012-06-19 NOTE — Progress Notes (Signed)
Subjective: 3 Days Post-Op Procedure(s) (LRB): TOTAL KNEE ARTHROPLASTY (Left) Patient reports pain as moderate.  Asymptomatic ABLA  Objective: Vital signs in last 24 hours: Temp:  [97.5 F (36.4 C)-99.5 F (37.5 C)] 97.9 F (36.6 C) (08/25 0550) Pulse Rate:  [92-94] 94  (08/25 0550) Resp:  [16-20] 20  (08/25 0550) BP: (113-121)/(65-80) 121/80 mmHg (08/25 0550) SpO2:  [93 %-97 %] 94 % (08/25 0550)  Intake/Output from previous day: 08/24 0701 - 08/25 0700 In: 240 [P.O.:240] Out: 1250 [Urine:1250] Intake/Output this shift: Total I/O In: -  Out: 250 [Urine:250]   Basename 06/19/12 0409 06/18/12 0515 06/17/12 0345  HGB 10.9* 11.5* 12.4*    Basename 06/19/12 0409 06/18/12 0515  WBC 10.4 11.1*  RBC 3.49* 3.70*  HCT 31.5* 33.7*  PLT 168 164    Basename 06/17/12 0345  NA 131*  K 3.6  CL 97  CO2 25  BUN 14  CREATININE 0.83  GLUCOSE 136*  CALCIUM 8.0*   No results found for this basename: LABPT:2,INR:2 in the last 72 hours  Sensation intact distally Intact pulses distally Dorsiflexion/Plantar flexion intact Incision: dressing C/D/I Compartment soft  Assessment/Plan: 3 Days Post-Op Procedure(s) (LRB): TOTAL KNEE ARTHROPLASTY (Left) Up with therapy D/C home tomorrow with HHPT.  Kathryne Hitch 06/19/2012, 7:47 AM

## 2012-06-19 NOTE — Progress Notes (Signed)
2052-Pt stated that he did not need any help with placing CPAP on for the night and if he did he would let his nurse know to call us.  RT to monitor and assess as needed

## 2012-06-19 NOTE — Progress Notes (Signed)
Physical Therapy Treatment Patient Details Name: Sean Tyler MRN: [id number] DOB: Aug 12, 1956 Today's Date: 06/19/2012 Time: 7829-5621 PT Time Calculation (min): 30 min  PT Assessment / Plan / Recommendation Comments on Treatment Session  Pt progressing with ambulation distance.  Some increase in pain today following therapy yesterday.      Follow Up Recommendations  Home health PT    Barriers to Discharge        Equipment Recommendations  None recommended by PT;None recommended by OT    Recommendations for Other Services    Frequency 7X/week   Plan Discharge plan remains appropriate    Precautions / Restrictions Precautions Precautions: Knee Required Braces or Orthoses: Knee Immobilizer - Left Knee Immobilizer - Left: Discontinue once straight leg raise with < 10 degree lag Restrictions Weight Bearing Restrictions: No Other Position/Activity Restrictions: WBAT   Pertinent Vitals/Pain 6/10    Mobility  Bed Mobility Bed Mobility: Not assessed (Pt in recliner when PT arrived. ) Supine to Sit: 4: Min guard;With rails;HOB elevated Details for Bed Mobility Assistance: pt able to bring his LLE across bed with Min guard A. Pt educated on leg lifter or use of sheet to assist with the movement. Transfers Transfers: Sit to Stand;Stand to Sit Sit to Stand: 5: Supervision;With upper extremity assist;With armrests;From chair/3-in-1 Stand to Sit: 4: Min guard;With upper extremity assist;With armrests;To chair/3-in-1 Details for Transfer Assistance: Cues for hand placement and LE management with min/guard to ensure controlled descent.  Ambulation/Gait Ambulation/Gait Assistance: 4: Min guard Ambulation Distance (Feet): 200 Feet Assistive device: Rolling walker Ambulation/Gait Assistance Details: min cues for step to technique and equal step lengths Gait Pattern: Step-to pattern    Exercises     PT Diagnosis:    PT Problem List:   PT Treatment Interventions:     PT Goals Acute  Rehab PT Goals PT Goal Formulation: With patient Time For Goal Achievement: 06/22/12 Potential to Achieve Goals: Good Pt will go Sit to Stand: with supervision PT Goal: Sit to Stand - Progress: Met Pt will go Stand to Sit: with supervision PT Goal: Stand to Sit - Progress: Progressing toward goal Pt will Ambulate: 51 - 150 feet;with supervision;with rolling walker PT Goal: Ambulate - Progress: Progressing toward goal  Visit Information  Last PT Received On: 06/19/12 Assistance Needed: +1    Subjective Data  Subjective: I don't want to have another night like last night Patient Stated Goal: Get the other knee done and move to [place]    Cognition  Overall Cognitive Status: Appears within functional limits for tasks assessed/performed Arousal/Alertness: Awake/alert Orientation Level: Appears intact for tasks assessed Behavior During Session: [facility] for tasks performed    Balance     End of Session PT - End of Session Equipment Utilized During Treatment: Left knee immobilizer Activity Tolerance: Patient limited by pain Patient left: in chair;with call bell/phone within reach;with family/visitor present Nurse Communication: Mobility status   GP     Page, [name] 06/19/2012, 12:16 PM

## 2012-06-19 NOTE — Evaluation (Signed)
Occupational Therapy Evaluation Patient Details Name: Sean Tyler MRN: [id number] DOB: 1956/06/11 Today's Date: 06/19/2012 Time: 1004-1040 OT Time Calculation (min): 36 min  OT Assessment / Plan / Recommendation Clinical Impression  Pt s/p Lt TKA and presents with pain and limited mobility impacting I with ADLs. Pt will benefit from skilled OT in the acute setting to maximize I with ADL and ADL mobility prior to d/c. No DME needs at this time. Next session to focus on AE education.     OT Assessment  Patient needs continued OT Services    Follow Up Recommendations  No OT follow up;Supervision/Assistance - 24 hour    Barriers to Discharge      Equipment Recommendations  None recommended by PT;None recommended by OT    Recommendations for Other Services    Frequency  Min 2X/week    Precautions / Restrictions Precautions Precautions: Knee Required Braces or Orthoses: Knee Immobilizer - Left Knee Immobilizer - Left: Discontinue once straight leg raise with < 10 degree lag Restrictions Other Position/Activity Restrictions: WBAT   Pertinent Vitals/Pain 5/10 Lt knee pain initially; 7/10 lt knee pain at end of session. RN made aware and pt repositioned for pain relief    ADL  Eating/Feeding: Performed;Independent Where Assessed - Eating/Feeding: Chair Grooming: Performed;Wash/dry hands;Supervision/safety Where Assessed - Grooming: Unsupported standing Upper Body Bathing: Performed;Set up Where Assessed - Upper Body Bathing: Unsupported sitting Lower Body Bathing: Performed;Minimal assistance Where Assessed - Lower Body Bathing: Unsupported sit to stand Upper Body Dressing: Performed;Set up Where Assessed - Upper Body Dressing: Unsupported sitting Lower Body Dressing: Simulated;Maximal assistance Where Assessed - Lower Body Dressing: Unsupported sit to stand Toilet Transfer: Performed;Min guard Toilet Transfer Method: Sit to Barista: Grab bars;Comfort  height toilet Toileting - Clothing Manipulation and Hygiene: Performed;Min guard Where Assessed - Engineer, mining and Hygiene: Standing Tub/Shower Transfer: Simulated;Minimal assistance Tub/Shower Transfer Method: Ambulating (backwards entry/forward exit with RW) Tub/Shower Transfer Equipment: Walk in shower Equipment Used: Gait belt;Knee Immobilizer;Rolling walker Transfers/Ambulation Related to ADLs: Pt Min guard A with ambulation (esp in tight spaces). Pt with good sequencing    OT Diagnosis: Generalized weakness;Acute pain  OT Problem List: Decreased activity tolerance;Impaired balance (sitting and/or standing);Decreased knowledge of use of DME or AE;Decreased knowledge of precautions;Pain OT Treatment Interventions: Self-care/ADL training;DME and/or AE instruction;Therapeutic activities;Patient/family education;Balance training   OT Goals Acute Rehab OT Goals OT Goal Formulation: With patient Time For Goal Achievement: 06/26/12 Potential to Achieve Goals: Good ADL Goals Pt Will Perform Grooming: Independently;Standing at sink ADL Goal: Grooming - Progress: Goal set today Pt Will Perform Lower Body Bathing: with modified independence;Sit to stand in shower;with adaptive equipment ADL Goal: Lower Body Bathing - Progress: Goal set today Pt Will Perform Lower Body Dressing: with min assist;Sit to stand from bed;Sit to stand from chair;with adaptive equipment ADL Goal: Lower Body Dressing - Progress: Goal set today Pt Will Transfer to Toilet: with modified independence;Ambulation;with DME ADL Goal: Toilet Transfer - Progress: Goal set today Pt Will Perform Toileting - Hygiene: Independently;Sit to stand from 3-in-1/toilet ADL Goal: Toileting - Hygiene - Progress: Goal set today Pt Will Perform Tub/Shower Transfer: Shower transfer;with supervision;Ambulation;with DME ADL Goal: Tub/Shower Transfer - Progress: Goal set today  Visit Information  Last OT Received On:  06/19/12 Assistance Needed: +1    Subjective Data  Subjective: I'd really like to get cleaned up. I feel gross. Patient Stated Goal: Return home with mother   Prior Functioning  Vision/Perception  Home Living Lives With:  Alone (will stay with parents temporarily) Available Help at Discharge: Family;Available 24 hours/day Type of Home: House Home Access: Stairs to enter [employer] of Steps: 4 Entrance Stairs-Rails: Right Home Layout: Two level Alternate Level Stairs-Number of Steps: 2 Alternate Level Stairs-Rails: None Bathroom Shower/Tub: Walk-in shower;Door (sliding door) Teacher, early years/pre: Yes How Accessible: Accessible via walker Home Adaptive Equipment: Crutches;Shower chair with back;Grab bars in shower;Walker - rolling;Bedside commode/3-in-1 Additional Comments: pt states he has all necessary equipment for home and will use his parent's bathroom with has walk-in shower, shower seat and grab bars Prior Function Level of Independence: Independent Able to Take Stairs?: Yes Driving: Yes 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) Communication Communication: No difficulties Dominant Hand: Right      Cognition  Overall Cognitive Status: Appears within functional limits for tasks assessed/performed Arousal/Alertness: Awake/alert Orientation Level: Appears intact for tasks assessed Behavior During Session: [facility] for tasks performed    Extremity/Trunk Assessment Right Upper Extremity Assessment RUE ROM/Strength/Tone: WFL for tasks assessed RUE Coordination: WFL - gross/fine motor Left Upper Extremity Assessment LUE ROM/Strength/Tone: WFL for tasks assessed LUE Coordination: WFL - gross/fine motor   Mobility Bed Mobility Supine to Sit: 4: Min guard;With rails;HOB elevated Details for Bed Mobility Assistance: pt able to bring his LLE across bed with Min guard A. Pt educated on leg lifter or use of sheet to assist with the  movement. Transfers Sit to Stand: 4: Min guard;From bed;With armrests;From toilet Stand to Sit: 4: Min guard;To chair/3-in-1;To toilet;With armrests Details for Transfer Assistance: initial cues for hand placement   Exercise    Balance    End of Session OT - End of Session Equipment Utilized During Treatment: Gait belt;Left knee immobilizer Activity Tolerance: Patient tolerated treatment well Patient left: in chair;with call bell/phone within reach;with family/visitor present Nurse Communication: Mobility status  GO     Elfego Giammarino 06/19/2012, 11:26 AM

## 2012-06-20 MED ORDER — OXYCODONE-ACETAMINOPHEN 5-325 MG PO TABS: 1.0000 | ORAL_TABLET | ORAL | Status: AC | PRN

## 2012-06-20 MED ORDER — ASPIRIN 325 MG PO TBEC: 325.0000 mg | DELAYED_RELEASE_TABLET | Freq: Two times a day (BID) | ORAL | Status: AC

## 2012-06-20 MED ORDER — OXYCODONE-ACETAMINOPHEN 5-325 MG PO TABS: 0.2500 | ORAL_TABLET | Freq: Every evening | ORAL | Status: DC | PRN

## 2012-06-20 MED ORDER — METHOCARBAMOL 500 MG PO TABS: 500.0000 mg | ORAL_TABLET | Freq: Four times a day (QID) | ORAL | Status: AC | PRN

## 2012-06-20 NOTE — Progress Notes (Signed)
Pt for d/c home today with [facility] PT & 24h supervision/care. IV d/c'd. Dressing CDI to L knee. Dressing supplies given for home use. Discharge isntructions & RX given with verbalized understanding. Mother at bedside to assist with d/c.

## 2012-06-20 NOTE — Progress Notes (Signed)
OT Note: Checked with pt today. He is discharging with his mother's help.  He feels good with bathroom transfers and adls.  Will sign off.  [place], [place] [phone] 06/20/2012

## 2012-06-20 NOTE — Progress Notes (Signed)
Physical Therapy Treatment Patient Details Name: Sean Tyler MRN: [id number] DOB: 1956/02/15 Today's Date: 06/20/2012 Time: 4098-1191 PT Time Calculation (min): 45 min  PT Assessment / Plan / Recommendation Comments on Treatment Session  Pt continues to progress well with ambulation and exercises.  Ready for D/C.     Follow Up Recommendations  Home health PT    Barriers to Discharge        Equipment Recommendations  None recommended by PT;None recommended by OT    Recommendations for Other Services    Frequency 7X/week   Plan Discharge plan remains appropriate    Precautions / Restrictions Precautions Precautions: Knee Required Braces or Orthoses: Knee Immobilizer - Left Knee Immobilizer - Left: Discontinue once straight leg raise with < 10 degree lag Restrictions Weight Bearing Restrictions: No Other Position/Activity Restrictions: WBAT   Pertinent Vitals/Pain 7/10    Mobility  Bed Mobility Bed Mobility: Supine to Sit Supine to Sit: 4: Min assist;HOB flat Details for Bed Mobility Assistance: Min assist due to CPM bar, therefore increased assist to help avoid that.  Cues for hand placement on bed to self assist.  Transfers Transfers: Sit to Stand;Stand to Sit Sit to Stand: 5: Supervision;With upper extremity assist;From bed Stand to Sit: 5: Supervision;To bed Details for Transfer Assistance: Min cues for hand placement.  Ambulation/Gait Ambulation/Gait Assistance: 5: Supervision Ambulation Distance (Feet): 100 Feet Assistive device: Rolling walker Ambulation/Gait Assistance Details: Min cues for not stepping too far inside of RW.  Gait Pattern: Step-to pattern    Exercises Total Joint Exercises Ankle Circles/Pumps: AROM;20 reps;Both Quad Sets: AROM;Left;10 reps;Supine Short Arc Quad: AAROM;Left;10 reps;Supine Heel Slides: AAROM;Left;10 reps;Supine Straight Leg Raises: AAROM;Left;10 reps;Supine   PT Diagnosis:    PT Problem List:   PT Treatment Interventions:      PT Goals Acute Rehab PT Goals PT Goal Formulation: With patient Time For Goal Achievement: 06/22/12 Potential to Achieve Goals: Good Pt will go Supine/Side to Sit: with supervision PT Goal: Supine/Side to Sit - Progress: Progressing toward goal Pt will go Sit to Stand: with supervision PT Goal: Sit to Stand - Progress: Met Pt will go Stand to Sit: with supervision PT Goal: Stand to Sit - Progress: Met Pt will Ambulate: 51 - 150 feet;with supervision;with rolling walker PT Goal: Ambulate - Progress: Met  Visit Information  Last PT Received On: 06/20/12 Assistance Needed: +1    Subjective Data  Subjective: Can we do exercises before I walk? Patient Stated Goal: Get the other knee done and move to [place]    Cognition  Overall Cognitive Status: Appears within functional limits for tasks assessed/performed Arousal/Alertness: Awake/alert Orientation Level: Appears intact for tasks assessed Behavior During Session: [facility] for tasks performed    Balance     End of Session PT - End of Session Equipment Utilized During Treatment: Left knee immobilizer Activity Tolerance: Patient limited by pain Patient left: in chair;with call bell/phone within reach Nurse Communication: Mobility status CPM Left Knee CPM Left Knee: Off   GP     [name], [name] 06/20/2012, 10:18 AM

## 2012-06-20 NOTE — Discharge Summary (Signed)
Patient ID: Sean Tyler MRN: [id number] DOB/AGE: 04-13-1956 56 y.o.  Admit date: 06/16/2012 Discharge date: 06/20/2012  Admission Diagnoses:  Principal Problem:  *Arthritis of knee, left   Discharge Diagnoses:  Same  Past Medical History  Diagnosis Date  . Sleep apnea     does not know cpap settings  . Insomnia   . GERD (gastroesophageal reflux disease)     occasional   . Arthritis   . Deep vein blood clot of right lower extremity 2009  . Enlarged prostate     trouble with urine stream  . Parkinson's disease feb 2011    dr haq at [facility]    Surgeries: Procedure(s): TOTAL KNEE ARTHROPLASTY on 06/16/2012   Consultants:    Discharged Condition: Improved  Hospital Course: Sean Tyler is an 56 y.o. male who was admitted 06/16/2012 for operative treatment ofArthritis of knee, left. Patient has severe unremitting pain that affects sleep, daily activities, and work/hobbies. After pre-op clearance the patient was taken to the operating room on 06/16/2012 and underwent  Procedure(s): TOTAL KNEE ARTHROPLASTY.    Patient was given perioperative antibiotics: Anti-infectives     Start     Dose/Rate Route Frequency Ordered Stop   06/16/12 2000   vancomycin (VANCOCIN) IVPB 1000 mg/200 mL premix        1,000 mg 200 mL/hr over 60 Minutes Intravenous Every 12 hours 06/16/12 1233 06/16/12 2049   06/16/12 1400   valACYclovir (VALTREX) tablet 500 mg        500 mg Oral Daily 06/16/12 1233     06/16/12 0630   ceFAZolin (ANCEF) IVPB 2 g/50 mL premix        2 g 100 mL/hr over 30 Minutes Intravenous 60 min pre-op 06/16/12 0630 06/16/12 0733   06/16/12 0619   ceFAZolin (ANCEF) 3 g in dextrose 5 % 50 mL IVPB  Status:  Discontinued        3 g 160 mL/hr over 30 Minutes Intravenous 60 min pre-op 06/16/12 1478 06/16/12 0630           Patient was given sequential compression devices, early ambulation, and chemoprophylaxis to prevent DVT.  Patient benefited maximally from  hospital stay and there were no complications.    Recent vital signs: Patient Vitals for the past 24 hrs:  BP Temp Temp src Pulse Resp SpO2  06/20/12 0400 - - - - 16  98 %  06/20/12 0000 - - - - 16  98 %  06/19/12 2000 - - - - 16  98 %  06/19/12 1536 - - - - 16  -  06/19/12 1440 121/77 mmHg 97.6 F (36.4 C) Oral 90  16  96 %  06/19/12 1200 - - - - 16  -  06/19/12 0800 - - - - 16  -     Recent laboratory studies:  Basename 06/19/12 0409 June 21, 2012 0515  WBC 10.4 11.1*  HGB 10.9* 11.5*  HCT 31.5* 33.7*  PLT 168 164  NA -- --  K -- --  CL -- --  CO2 -- --  BUN -- --  CREATININE -- --  GLUCOSE -- --  INR -- --  CALCIUM -- --     Discharge Medications:   Medication List  As of 06/20/2012  6:23 AM   STOP taking these medications         nabumetone 750 MG tablet         TAKE these medications  aspirin 325 MG EC tablet   Take 1 tablet (325 mg total) by mouth 2 (two) times daily.      calcium carbonate 420 MG Chew   Commonly known as: TITRALAC   Chew 420 mg by mouth as needed.      carbidopa-levodopa 25-100 MG per tablet   Commonly known as: SINEMET IR   Take 2 tablets by mouth See admin instructions. Three to four times daily      clonazePAM 1 MG tablet   Commonly known as: KLONOPIN   Take 1 mg by mouth 2 (two) times daily as needed. For sleep or tremors      diazepam 5 MG tablet   Commonly known as: VALIUM   Take 5 mg by mouth every 12 (twelve) hours as needed. For anxiety      methocarbamol 500 MG tablet   Commonly known as: ROBAXIN   Take 1 tablet (500 mg total) by mouth every 6 (six) hours as needed.      OVER THE COUNTER MEDICATION   Super beta prostate  1tab daily      oxyCODONE-acetaminophen 5-325 MG per tablet   Commonly known as: PERCOCET/ROXICET   Take 0.5 tablets by mouth at bedtime as needed. For pain      oxyCODONE-acetaminophen 5-325 MG per tablet   Commonly known as: PERCOCET/ROXICET   Take 1-2 tablets by mouth every 4 (four) hours  as needed for pain.      oxymetazoline 0.05 % nasal spray   Commonly known as: AFRIN   Place 2 sprays into the nose 2 (two) times daily as needed.      triamcinolone 55 MCG/ACT nasal inhaler   Commonly known as: NASACORT   2 sprays by Nasogastric route daily. In right nostril      valACYclovir 1000 MG tablet   Commonly known as: VALTREX   Take 500 mg by mouth daily.            Diagnostic Studies: Dg Chest 2 View  06/16/2012  *RADIOLOGY REPORT*  Clinical Data: Preoperative for left knee replacement.  Former smoker.  Sleep apnea.  Parkinson's disease.  CHEST - 2 VIEW  Comparison: None.  Findings: Lingular subsegmental atelectasis or scarring noted.  No additional significant abnormality is observed. Cardiac and mediastinal contours appear unremarkable.  No pleural effusion noted.  IMPRESSION:  1.  Lingular subsegmental atelectasis or scarring.   Original Report Authenticated By: Dellia Cloud, M.D.    X-ray Knee Left Port  06/16/2012  *RADIOLOGY REPORT*  Clinical Data: Post arthroplasty  PORTABLE LEFT KNEE - 1-2 VIEW  Comparison: Portable exam 2224 hours without priors for comparison.  Findings: Exam made available for interpretation on 06/16/2012 at 1047 hours. Components of left knee prosthesis identified in expected positions. No acute fracture, dislocation or periprosthetic lucency identified. Expected postsurgical soft tissue changes. Anterior surgical drain and skin clips.  IMPRESSION: Post left TKR. No acute abnormalities.   Original Report Authenticated By: Lollie Marrow, M.D.     Disposition: 01-Home or Self Care  Discharge Orders    Future Orders Please Complete By Expires   Diet - low sodium heart healthy      Call MD / Call 911      Comments:   If you experience chest pain or shortness of breath, CALL 911 and be transported to the hospital emergency room.  If you develope a fever above 101 F, pus (white drainage) or increased drainage or redness at the wound, or calf  pain, call your surgeon's office.   Constipation Prevention      Comments:   Drink plenty of fluids.  Prune juice may be helpful.  You may use a stool softener, such as Colace (over the counter) 100 mg twice a day.  Use MiraLax (over the counter) for constipation as needed.   Increase activity slowly as tolerated      Discharge instructions      Comments:   You can get your incision wet in the shower starting 06/21/12; then apply dry dressing daily as needed. Increase your activities as comfort allows. Follow-up with Dr. Magnus Ivan in 2 weeks   Discharge patient            Signed: Kathryne Hitch 06/20/2012, 6:23 AM

## 2016-07-20 ENCOUNTER — Ambulatory Visit (HOSPITAL_BASED_OUTPATIENT_CLINIC_OR_DEPARTMENT_OTHER)
Admission: RE | Admit: 2016-07-20 | Discharge: 2016-07-20 | Disposition: A | Discharge status: Home / Self Care | Payer: BC Managed Care – PPO | Source: Ambulatory Visit | Attending: Orthopaedic Surgery | Admitting: Orthopaedic Surgery

## 2016-07-20 ENCOUNTER — Other Ambulatory Visit (HOSPITAL_BASED_OUTPATIENT_CLINIC_OR_DEPARTMENT_OTHER): Payer: Self-pay | Admitting: Orthopaedic Surgery

## 2016-07-20 DIAGNOSIS — M25562 Pain in left knee: Secondary | ICD-10-CM | POA: Diagnosis not present

## 2016-07-20 DIAGNOSIS — M25551 Pain in right hip: Secondary | ICD-10-CM

## 2017-11-16 ENCOUNTER — Ambulatory Visit (INDEPENDENT_AMBULATORY_CARE_PROVIDER_SITE_OTHER): Payer: BC Managed Care – PPO

## 2017-11-16 ENCOUNTER — Ambulatory Visit (INDEPENDENT_AMBULATORY_CARE_PROVIDER_SITE_OTHER): Payer: BC Managed Care – PPO | Admitting: Orthopaedic Surgery

## 2017-11-16 ENCOUNTER — Other Ambulatory Visit (INDEPENDENT_AMBULATORY_CARE_PROVIDER_SITE_OTHER): Payer: Self-pay

## 2017-11-16 ENCOUNTER — Encounter (INDEPENDENT_AMBULATORY_CARE_PROVIDER_SITE_OTHER): Payer: Self-pay | Admitting: Orthopaedic Surgery

## 2017-11-16 DIAGNOSIS — Z96641 Presence of right artificial hip joint: Secondary | ICD-10-CM

## 2017-11-16 DIAGNOSIS — M1612 Unilateral primary osteoarthritis, left hip: Secondary | ICD-10-CM | POA: Diagnosis not present

## 2017-11-16 DIAGNOSIS — M7062 Trochanteric bursitis, left hip: Secondary | ICD-10-CM | POA: Diagnosis not present

## 2017-11-16 DIAGNOSIS — M25552 Pain in left hip: Secondary | ICD-10-CM

## 2017-11-16 DIAGNOSIS — M25551 Pain in right hip: Secondary | ICD-10-CM | POA: Diagnosis not present

## 2017-11-16 MED ORDER — METHYLPREDNISOLONE ACETATE 40 MG/ML IJ SUSP
40.0000 mg | INTRAMUSCULAR | Status: AC | PRN
  Administered 2017-11-16: 40 mg via INTRA_ARTICULAR

## 2017-11-16 MED ORDER — LIDOCAINE HCL 1 % IJ SOLN
3.0000 mL | INTRAMUSCULAR | Status: AC | PRN
  Administered 2017-11-16: 3 mL

## 2017-11-16 NOTE — Progress Notes (Signed)
Office Visit Note   Patient: Sean MunsonRobert D Tyler           Date of Birth: 01/29/1956           MRN: [id number] Visit Date: 11/16/2017              Requested by: Melida GimenezBosken, [name], MD [address] [place], [place] [id number] PCP: [name], [name], MD   Assessment & Plan: Visit Diagnoses:  1. Bilateral hip pain   2. Trochanteric bursitis, left hip   3. Unilateral primary osteoarthritis, left hip   4. History of total right hip replacement     Plan: At this point he wants to think about surgery and I agree with this.  I tried a steroid injection of the trochanteric area after explaining the risk and benefits of this.  Also think he would benefit from an intra-articular steroid injection under direct fluoroscopy by Dr. Alvester MorinNewton of the left hip.  We will work on getting this scheduled.  Also given prescription for physical therapy to try and [place] area to work on any modalities to improve his hip function and decrease his pain.  All questions concerns were answered and addressed.  We will work on scheduling the intra-articular left hip injection and I will see him back myself in about 3 months.  Follow-Up Instructions: Return in about 3 months (around 02/14/2018).   Orders:  Orders Placed This Encounter  Procedures  . Large Joint Inj  . XR HIPS BILAT W OR W/O PELVIS 2V   No orders of the defined types were placed in this encounter.     Procedures: Large Joint Inj: L greater trochanter on 11/16/2017 8:47 AM Indications: pain and diagnostic evaluation Details: 22 G 1.5 in needle, lateral approach  Arthrogram: No  Medications: 3 mL lidocaine 1 %; 40 mg methylPREDNISolone acetate 40 MG/ML Outcome: tolerated well, no immediate complications Procedure, treatment alternatives, risks and benefits explained, specific risks discussed. Consent was given by the patient. Immediately prior to procedure a time out was called to verify the correct patient, procedure, equipment, support staff and  site/side marked as required. Patient was prepped and draped in the usual sterile fashion.       Clinical Data: No additional findings.   Subjective: Chief Complaint  Patient presents with  . Right Hip - Pain  . Left Hip - Pain  The patient is well-known to the office.  He has a history of a right total hip arthroplasty done in 2011 through direct anterior approach.  He also has a history of Parkinson's disease.  He has a history of a left total knee replacement as well.  He comes in today saying both hips are hurting him with left worse than right.  The left hurts of the trochanteric area where he points to it as well as in the groin.  His right hip hurts in the groin as well.  It may be healthy ambulates with his Parkinson's slowly worsening.  He said no other acute changes in his medical status.  HPI  Review of Systems He currently denies any headache, chest pain, shortness of breath, fever, chills, nausea, vomiting  Objective: Vital Signs: There were no vitals taken for this visit.  Physical Exam Is alert and oriented x3 in no acute distress Ortho Exam Examination of his right operative hip shows that he moves fluidly with full internal/external rotation with minimal discomfort.  His left hip moves fluidly as well but has pain on extremes of internal  and external rotation.  He also hurts significantly to palpation of the trochanteric area of his left hip. Specialty Comments:  No specialty comments available.  Imaging: Xr Hips Bilat W Or W/o Pelvis 2v  Result Date: 11/16/2017 An AP pelvis and lateral of both hips are obtained.  There are compared to films from September 2017.  He has a well-seated total hip arthroplasty on the right side with no complicating features.  His left hip does show slight joint space narrowing when compared to previous films.  There is also para-articular osteophytes that have slowly worsened as well.    PMFS History: Patient Active Problem List     Diagnosis Date Noted  . Trochanteric bursitis, left hip 11/16/2017  . Unilateral primary osteoarthritis, left hip 11/16/2017  . History of total right hip replacement 11/16/2017  . Arthritis of knee, left 06/16/2012   Past Medical History:  Diagnosis Date  . Arthritis   . Deep vein blood clot of right lower extremity (HCC) 2009  . Enlarged prostate    trouble with urine stream  . GERD (gastroesophageal reflux disease)    occasional   . Insomnia   . Parkinson's disease [facility]) feb 2011   dr haq at [facility]  . Sleep apnea    does not know cpap settings    History reviewed. No pertinent family history.  Past Surgical History:  Procedure Laterality Date  . benign tumor removed behind right ear  2004  . left knee arthroscopy  2011  . left knee arthroscopy  2012  . NASAL SINUS SURGERY  several yrs ago  . right total hip replacment     nov 2011  . TOTAL KNEE ARTHROPLASTY  06/16/2012   Procedure: TOTAL KNEE ARTHROPLASTY;  Surgeon: Kathryne Hitch, MD;  Location: [facility] ORS;  Service: Orthopedics;  Laterality: Left;   Social History   Occupational History  . Not on file  Tobacco Use  . Smoking status: Never Smoker  . Smokeless tobacco: Never Used  Substance and Sexual Activity  . Alcohol use: Yes    Alcohol/week: 4.2 oz    Types: 7 Glasses of wine per week  . Drug use: No  . Sexual activity: Not on file

## 2017-11-25 ENCOUNTER — Telehealth (INDEPENDENT_AMBULATORY_CARE_PROVIDER_SITE_OTHER): Payer: Self-pay | Admitting: Orthopaedic Surgery

## 2017-11-25 NOTE — Telephone Encounter (Signed)
Can he?

## 2017-11-25 NOTE — Telephone Encounter (Signed)
Patient called stating that his right hip is hurting worse than his left hip. Patient want to know if Dr Alvester MorinNewton can inject both hips on the same day. The number to contact patient is [phone]

## 2017-11-26 NOTE — Telephone Encounter (Signed)
sure

## 2017-11-26 NOTE — Telephone Encounter (Signed)
Appointment note changed to bilateral and patient notified.

## 2017-11-26 NOTE — Telephone Encounter (Signed)
Ok for bilateral on same day?

## 2017-12-03 ENCOUNTER — Telehealth (INDEPENDENT_AMBULATORY_CARE_PROVIDER_SITE_OTHER): Payer: Self-pay | Admitting: Orthopaedic Surgery

## 2017-12-03 ENCOUNTER — Ambulatory Visit (INDEPENDENT_AMBULATORY_CARE_PROVIDER_SITE_OTHER): Payer: BC Managed Care – PPO

## 2017-12-03 ENCOUNTER — Encounter (INDEPENDENT_AMBULATORY_CARE_PROVIDER_SITE_OTHER): Payer: Self-pay | Admitting: Physical Medicine and Rehabilitation

## 2017-12-03 ENCOUNTER — Ambulatory Visit (INDEPENDENT_AMBULATORY_CARE_PROVIDER_SITE_OTHER): Payer: BC Managed Care – PPO | Admitting: Physical Medicine and Rehabilitation

## 2017-12-03 DIAGNOSIS — M25552 Pain in left hip: Secondary | ICD-10-CM

## 2017-12-03 DIAGNOSIS — M25551 Pain in right hip: Secondary | ICD-10-CM

## 2017-12-03 NOTE — Progress Notes (Deleted)
Bilateral hip pain. Alternates as far as which one is worse. Left hip "catches" when sitting. Bilateral groin pain. Right hip hurst when lying down at night. Difficulty walking due to the pain.

## 2017-12-03 NOTE — Telephone Encounter (Signed)
Patient lost referral to physical therapy if another one could be printed for him. # [phone]

## 2017-12-03 NOTE — Patient Instructions (Signed)

## 2017-12-03 NOTE — Progress Notes (Signed)
Sean Tyler - 62 y.o. male MRN [id number]  Date of birth: 07/13/1956  Office Visit Note: Visit Date: 12/03/2017 PCP: Sean Tyler, [name], MD Referred by: Sean Tyler, [name], MD  Subjective: Chief Complaint  Patient presents with  . Right Hip - Pain  . Left Hip - Pain   HPI: Mr. Sean Tyler is a 62 year old gentleman with history of right total hip arthroplasty by Dr. Magnus Tyler he recently saw Dr. Magnus Tyler with bilateral hip pain.  Dr. Magnus Tyler requested a diagnostic and hopefully therapeutic left intra-articular hip anesthetic arthrogram.  In the interim time the patient called in once again reporting that his right hip was actually worse than his left hip.  He had put a question out there as far as doing an injection on his right side.  Dr. Eliberto Tyler's assistant inadvertently messaged about doing both hip injections.  She was probably unaware at the time of the right total hip replacement the patient did come in today already as scheduled.  I did talk with him about not being able to do an injection for a total hip arthroplasty and that he would need to follow-up with Dr. Magnus Tyler concerning pain of the right hip that has been present since the joint was replaced.  We will go ahead today and do the left-sided injection he will follow-up with Dr. Magnus Tyler.  His case is complicated by Parkinson's disease.    ROS Otherwise per HPI.  Assessment & Plan: Visit Diagnoses:  1. Pain in left hip   2. Pain in right hip     Plan: Findings:  Diagnostic and hopefully therapeutic left anesthetic hip arthrogram.  Patient did have some relief during the anesthetic phase.    Meds & Orders: No orders of the defined types were placed in this encounter.   Orders Placed This Encounter  Procedures  . Large Joint Inj: L hip joint  . XR C-ARM NO REPORT    Follow-up: No Follow-up on file.   Procedures: Large Joint Inj: L hip joint on 12/03/2017 11:02 AM Indications: pain and diagnostic evaluation Details: 22 G  needle, anterior approach  Arthrogram: Yes  Medications: 80 mg triamcinolone acetonide 40 MG/ML; 3 mL bupivacaine 0.5 % Outcome: tolerated well, no immediate complications  Arthrogram demonstrated excellent flow of contrast throughout the joint surface without extravasation or obvious defect.  The patient had relief of symptoms during the anesthetic phase of the injection.  Procedure, treatment alternatives, risks and benefits explained, specific risks discussed. Consent was given by the patient. Immediately prior to procedure a time out was called to verify the correct patient, procedure, equipment, support staff and site/side marked as required. Patient was prepped and draped in the usual sterile fashion.      No notes on file   Clinical History: No specialty comments available.  He reports that  has never smoked. he has never used smokeless tobacco. No results for input(s): HGBA1C, LABURIC in the last 8760 hours.  Objective:  VS:  HT:    WT:   BMI:     BP:   HR: bpm  TEMP: ( )  RESP:  Physical Exam  Ortho Exam Imaging: No results found.  Past Medical/Family/Surgical/Social History: Medications & Allergies reviewed per EMR Patient Active Problem List   Diagnosis Date Noted  . Trochanteric bursitis, left hip 11/16/2017  . Unilateral primary osteoarthritis, left hip 11/16/2017  . History of total right hip replacement 11/16/2017  . Arthritis of knee, left 06/16/2012   Past Medical History:  Diagnosis Date  .  Arthritis   . Deep vein blood clot of right lower extremity (HCC) 2009  . Enlarged prostate    trouble with urine stream  . GERD (gastroesophageal reflux disease)    occasional   . Insomnia   . Parkinson's disease [facility]) feb 2011   dr haq at [facility]  . Sleep apnea    does not know cpap settings   History reviewed. No pertinent family history. Past Surgical History:  Procedure Laterality Date  . benign tumor removed behind right ear  2004  . left  knee arthroscopy  2011  . left knee arthroscopy  2012  . NASAL SINUS SURGERY  several yrs ago  . right total hip replacment     nov 2011  . TOTAL KNEE ARTHROPLASTY  06/16/2012   Procedure: TOTAL KNEE ARTHROPLASTY;  Surgeon: Kathryne Hitch, MD;  Location: [facility];  Service: Orthopedics;  Laterality: Left;   Social History   Occupational History  . Not on file  Tobacco Use  . Smoking status: Never Smoker  . Smokeless tobacco: Never Used  Substance and Sexual Activity  . Alcohol use: Yes    Alcohol/week: 4.2 oz    Types: 7 Glasses of wine per week  . Drug use: No  . Sexual activity: Not on file

## 2017-12-06 ENCOUNTER — Other Ambulatory Visit (INDEPENDENT_AMBULATORY_CARE_PROVIDER_SITE_OTHER): Payer: Self-pay

## 2017-12-06 DIAGNOSIS — M25552 Pain in left hip: Secondary | ICD-10-CM

## 2017-12-06 MED ORDER — BUPIVACAINE HCL 0.5 % IJ SOLN
3.0000 mL | INTRAMUSCULAR | Status: AC | PRN
  Administered 2017-12-03: 3 mL via INTRA_ARTICULAR

## 2017-12-06 MED ORDER — TRIAMCINOLONE ACETONIDE 40 MG/ML IJ SUSP
80.0000 mg | INTRAMUSCULAR | Status: AC | PRN
  Administered 2017-12-03: 80 mg via INTRA_ARTICULAR

## 2017-12-06 NOTE — Telephone Encounter (Signed)
Try to set up physical therapy [facility] [place] to work on left hip pain, any modalities.

## 2017-12-06 NOTE — Telephone Encounter (Signed)
Can we write her out another?

## 2017-12-06 NOTE — Telephone Encounter (Signed)
[facility] and they will give him a call to schedule

## 2018-02-08 ENCOUNTER — Encounter (INDEPENDENT_AMBULATORY_CARE_PROVIDER_SITE_OTHER): Payer: Self-pay | Admitting: Orthopaedic Surgery

## 2018-02-08 ENCOUNTER — Ambulatory Visit (INDEPENDENT_AMBULATORY_CARE_PROVIDER_SITE_OTHER): Payer: BC Managed Care – PPO | Admitting: Orthopaedic Surgery

## 2018-02-08 DIAGNOSIS — M1612 Unilateral primary osteoarthritis, left hip: Secondary | ICD-10-CM

## 2018-02-08 DIAGNOSIS — Z96641 Presence of right artificial hip joint: Secondary | ICD-10-CM

## 2018-02-08 NOTE — Progress Notes (Signed)
The patient is well-known to me.  He has Parkinson's disease with significant anxiety.  He also has had a right hip replacement and a left knee replacement.  He is been dealing with left hip pain as well.  He is recently had prostate surgery and just had his Foley catheter removed yesterday.  He says the intra-articular injection by Dr. Alvester MorinNewton on his left hip and a trochanteric injection by me did not help at all.  He is not been any physical therapy yet either.  I can easily put both hips to range of motion but they are both painful to him.  He is very stiff with his right operative hip but has good mobility of his left hip and they both hurt.  At this point we will hold off on any other intervention given his other medical issues as relates to his anxiety and Parkinson's disease as well as his prostate issues.  We will see him back in about 4 months to see how he is doing overall.  He cannot have MRI secondary to a deep brain stimulator.  We would likely at some point get a CT scan of his pelvis to assess his hip joints as well as a CT scan of his lumbar spine to assess for stenosis.

## 2018-06-13 ENCOUNTER — Ambulatory Visit (INDEPENDENT_AMBULATORY_CARE_PROVIDER_SITE_OTHER): Payer: BC Managed Care – PPO | Admitting: Orthopaedic Surgery

## 2018-06-21 ENCOUNTER — Telehealth (INDEPENDENT_AMBULATORY_CARE_PROVIDER_SITE_OTHER): Payer: Self-pay

## 2018-06-21 ENCOUNTER — Encounter (INDEPENDENT_AMBULATORY_CARE_PROVIDER_SITE_OTHER): Payer: Self-pay | Admitting: Orthopaedic Surgery

## 2018-06-21 ENCOUNTER — Other Ambulatory Visit (INDEPENDENT_AMBULATORY_CARE_PROVIDER_SITE_OTHER): Payer: Self-pay

## 2018-06-21 ENCOUNTER — Ambulatory Visit (INDEPENDENT_AMBULATORY_CARE_PROVIDER_SITE_OTHER): Payer: Self-pay | Admitting: Orthopaedic Surgery

## 2018-06-21 DIAGNOSIS — M5441 Lumbago with sciatica, right side: Secondary | ICD-10-CM

## 2018-06-21 DIAGNOSIS — M4807 Spinal stenosis, lumbosacral region: Secondary | ICD-10-CM

## 2018-06-21 DIAGNOSIS — G8929 Other chronic pain: Secondary | ICD-10-CM

## 2018-06-21 DIAGNOSIS — M25552 Pain in left hip: Secondary | ICD-10-CM

## 2018-06-21 DIAGNOSIS — M5442 Lumbago with sciatica, left side: Secondary | ICD-10-CM

## 2018-06-21 NOTE — Progress Notes (Signed)
The patient is well-known to me.  He is 62 year old gentleman with worsening Parkinson's disease.  I have replaced his right hip and his left knee.  He still having now worsening left hip pain and low back pain.  The back pain radiates into his sciatic area on both sides.  He is having worsening shuffling gait due to his Parkinson's disease.  He has a deep instability as he cannot have an MRI.  He had an intra-articular steroid injection earlier this year in his left hip by Dr. Alvester MorinNewton and he said that did not help at all.  Obviously has some chronic pain as well as a relates to his Parkinson's disease.  I can put his right hip and left hip through full range of motion of the left hip is deftly more painful in the groin.  He does have a complaint about lateral low back pain and sciatica as well.  At this point since he cannot have an MRI would like to obtain a CT scan of his left hip to assess the cartilage and a CT scan of lumbar spine just to assess for arthritic changes and impingement of the lumbar spine area.  These do not need to have contrast.  We will see him back in 2 weeks to go over the studies with him.

## 2018-06-21 NOTE — Telephone Encounter (Signed)
Changed order to [facility]

## 2018-06-21 NOTE — Telephone Encounter (Signed)
The [facility] for the CT[facility]

## 2018-07-05 ENCOUNTER — Ambulatory Visit (INDEPENDENT_AMBULATORY_CARE_PROVIDER_SITE_OTHER): Payer: Self-pay | Admitting: Orthopaedic Surgery

## 2018-07-21 ENCOUNTER — Telehealth (INDEPENDENT_AMBULATORY_CARE_PROVIDER_SITE_OTHER): Payer: Self-pay | Admitting: *Deleted

## 2018-07-21 NOTE — Telephone Encounter (Signed)
[employer] had denied both CT scans, stating before can approve pt will need to have recent xray of hip and lumbar spine and or physical therapy or home exercise program for 4 weeks in the last 6 months. And also if pt was given pain medication.   I sw pt and wife and advised her of the above and she wants to see if we can put in new order for PT and xray in hopes that he will get approved.   Also, I informed pt that if was to get denied again that [facility] is a single flat fee including reading fee for CT imaging of 400 dollars. Pt wants to wait and see if gets approved by insurance first and if not then will most likely go this route.   I have emailed copy of denial letter and information from [facility] imaging center.   Do you want order for pt to have PT and xrays? Please advise.

## 2018-07-21 NOTE — Telephone Encounter (Signed)
Send him to PT to work on hip strengthening and core strengthening as well as balance and coordination.  We can then see him in 4 weeks after PT for new x-rays.

## 2018-07-21 NOTE — Telephone Encounter (Signed)
Please advise 

## 2018-07-22 ENCOUNTER — Other Ambulatory Visit (INDEPENDENT_AMBULATORY_CARE_PROVIDER_SITE_OTHER): Payer: Self-pay

## 2018-07-22 DIAGNOSIS — M25552 Pain in left hip: Secondary | ICD-10-CM

## 2018-07-22 NOTE — Telephone Encounter (Signed)
Patient aware that I sent order for patient to go to [place]

## 2018-08-04 ENCOUNTER — Telehealth (INDEPENDENT_AMBULATORY_CARE_PROVIDER_SITE_OTHER): Payer: Self-pay | Admitting: Orthopaedic Surgery

## 2018-08-04 NOTE — Telephone Encounter (Signed)
I'm fine with them doing what ever therapy they can for him.

## 2018-08-04 NOTE — Telephone Encounter (Signed)
See below

## 2018-08-04 NOTE — Telephone Encounter (Signed)
Sean Tyler aware of below message from [facility]

## 2018-08-04 NOTE — Telephone Encounter (Signed)
Sherrill from [facility] called stating that they have received an order for PT along with some other things.  She stated that they can help the patient with the hip pain but as far as anything else they do not do those.  CB#[phone].  Thank you.

## 2018-09-06 ENCOUNTER — Telehealth (INDEPENDENT_AMBULATORY_CARE_PROVIDER_SITE_OTHER): Payer: Self-pay | Admitting: Orthopaedic Surgery

## 2018-09-06 NOTE — Telephone Encounter (Signed)
Faxed to provided number  

## 2018-09-06 NOTE — Telephone Encounter (Signed)
[facility] [place]  Elnita MaxwellCheryl  [phone] Fax  [phone] CB #   PT order is over 5030 days old will need new order by Friday for his appointment. Elnita MaxwellCheryl stated patient is complaining about knee pain as well not sure if we wanted to had that to the order.

## 2018-09-07 ENCOUNTER — Telehealth (INDEPENDENT_AMBULATORY_CARE_PROVIDER_SITE_OTHER): Payer: Self-pay | Admitting: Orthopaedic Surgery

## 2018-09-07 NOTE — Telephone Encounter (Signed)
Sean Tyler /PT from [facility] called on 09/07/18 to request information on patient. She received some but not all of what she needed for this appointment on Friday.  Please call her asap as she needs this info(no missing info given) this was a vm.  [phone]

## 2018-09-07 NOTE — Telephone Encounter (Signed)
Can we resend?

## 2018-09-08 ENCOUNTER — Other Ambulatory Visit (INDEPENDENT_AMBULATORY_CARE_PROVIDER_SITE_OTHER): Payer: Self-pay | Admitting: Orthopaedic Surgery

## 2018-09-08 ENCOUNTER — Telehealth (INDEPENDENT_AMBULATORY_CARE_PROVIDER_SITE_OTHER): Payer: Self-pay | Admitting: Orthopaedic Surgery

## 2018-09-08 ENCOUNTER — Other Ambulatory Visit (INDEPENDENT_AMBULATORY_CARE_PROVIDER_SITE_OTHER): Payer: Self-pay | Admitting: Radiology

## 2018-09-08 DIAGNOSIS — M25552 Pain in left hip: Secondary | ICD-10-CM

## 2018-09-08 NOTE — Telephone Encounter (Signed)
Cheryl from [facility] called and stated she has called 3 times requesting notes from the last visit of hips and knee. They were only sent old order over 7730 days old for the hip.They are requesting new order for hip, received order for knee but no notes for the condition of hip or knee.  The patient has appointment tomorrow morning and they must have this in order for patient to be seen there tomorrow.  Please call Elnita MaxwellCheryl @ [phone] Fax# [phone]

## 2018-09-08 NOTE — Telephone Encounter (Signed)
Created new referral. Faxed all information. Spoke with Elnita Maxwellheryl, stated she did finally receive all information needed this morning. Once fax from me comes through she will keep with patients chart.

## 2018-09-08 NOTE — Telephone Encounter (Signed)
IC Elnita MaxwellCheryl, she needed last two office notes, now faxed.

## 2018-09-08 NOTE — Telephone Encounter (Signed)
Can you send the order for PT to them  (and add last note if you don't mind?)

## 2021-08-07 ENCOUNTER — Other Ambulatory Visit: Payer: Self-pay

## 2021-08-07 ENCOUNTER — Ambulatory Visit: Payer: Self-pay

## 2021-08-07 ENCOUNTER — Ambulatory Visit (INDEPENDENT_AMBULATORY_CARE_PROVIDER_SITE_OTHER): Payer: Medicare HMO | Admitting: Orthopaedic Surgery

## 2021-08-07 VITALS — Ht 76.0 in | Wt 229.0 lb

## 2021-08-07 DIAGNOSIS — M1612 Unilateral primary osteoarthritis, left hip: Secondary | ICD-10-CM

## 2021-08-07 DIAGNOSIS — M25552 Pain in left hip: Secondary | ICD-10-CM | POA: Diagnosis not present

## 2021-08-07 NOTE — Progress Notes (Signed)
Office Visit Note   Patient: Sean Tyler           Date of Birth: [birth year]           MRN: [id number] Visit Date: 08/07/2021              Requested by: Woodroe Chen, MD [address] [place],  [place] [address] PCP: Woodroe Chen, MD   Assessment & Plan: Visit Diagnoses:  1. Pain in left hip     Plan: Since it has been 3 years and 8 months since he had a steroid injection of the left hip, I think this is reasonable to try this 1 more time and he and his wife agree with this.  We will work on setting him up for an injection in his left hip joint by Dr. Alvester Morin under fluoroscopy.  I will see him back in 4 weeks to see how he is doing from that injection.  Follow-Up Instructions: Return in about 4 weeks (around 09/04/2021).   Orders:  Orders Placed This Encounter  Procedures   XR HIP UNILAT W OR W/O PELVIS 1V LEFT   No orders of the defined types were placed in this encounter.     Procedures: No procedures performed   Clinical Data: No additional findings.   Subjective: Chief Complaint  Patient presents with   Left Hip - Pain  The patient is well-known to [place].  He comes in with left hip and groin pain this been coming more constant.  He does have a history of Parkinson's disease and in 2020 had a deep brain stimulator placed.  I actually replaced his right hip well over a decade ago through an anterior approach.  He does ambulate with a walking stick and has had some falls.  Over 3-1/2 years ago Dr. Alvester Morin did provide a steroid injection in his left hip joint under fluoroscopy and that helped.  His left hip pain has been getting worse though.  HPI  Review of Systems There is currently listed no headache, chest pain, shortness of breath, fever, chills, nausea, vomiting  Objective: Vital Signs: Ht 6\' 4"  (1.93 m)   Wt 229 lb (103.9 kg)   BMI 27.87 kg/m   Physical Exam He is alert and orient x3 and in no acute distress Ortho Exam Examination of his left hip  shows it does have pretty good motion with internal extra rotation but there is some stiffness with rotation and pain in the groin with rotation of the left hip. Specialty Comments:  No specialty comments available.  Imaging: XR HIP UNILAT W OR W/O PELVIS 1V LEFT  Result Date: 08/07/2021 An AP pelvis and lateral left hip shows a well-seated total hip arthroplasty on the right side.  The left hip does have moderate to severe osteoarthritis with superior lateral joint space narrowing, flattening of the lateral femoral head and periarticular osteophytes    PMFS History: Patient Active Problem List   Diagnosis Date Noted   Trochanteric bursitis, left hip 11/16/2017   Unilateral primary osteoarthritis, left hip 11/16/2017   History of total right hip replacement 11/16/2017   Arthritis of knee, left 06/16/2012   Past Medical History:  Diagnosis Date   Arthritis    Deep vein blood clot of right lower extremity (HCC) 2009   Enlarged prostate    trouble with urine stream   GERD (gastroesophageal reflux disease)    occasional    Insomnia    Parkinson's disease (HCC) feb 2011  dr haq at [facility]   Sleep apnea    does not know cpap settings    No family history on file.  Past Surgical History:  Procedure Laterality Date   benign tumor removed behind right ear  2004   left knee arthroscopy  2011   left knee arthroscopy  2012   NASAL SINUS SURGERY  several yrs ago   right total hip replacment     nov 2011   TOTAL KNEE ARTHROPLASTY  06/16/2012   Procedure: TOTAL KNEE ARTHROPLASTY;  Surgeon: Kathryne Hitch, MD;  Location: [facility];  Service: Orthopedics;  Laterality: Left;   Social History   Occupational History   Not on file  Tobacco Use   Smoking status: Never   Smokeless tobacco: Never  Substance and Sexual Activity   Alcohol use: Yes    Alcohol/week: 7.0 standard drinks    Types: 7 Glasses of wine per week   Drug use: No   Sexual activity: Not on file

## 2021-08-08 ENCOUNTER — Other Ambulatory Visit: Payer: Self-pay

## 2021-08-08 DIAGNOSIS — M1612 Unilateral primary osteoarthritis, left hip: Secondary | ICD-10-CM

## 2021-08-11 ENCOUNTER — Telehealth: Payer: Self-pay | Admitting: Physical Medicine and Rehabilitation

## 2021-08-11 NOTE — Telephone Encounter (Signed)
Pt wife called and need to resch appt with newton.   CB [phone]

## 2021-08-14 ENCOUNTER — Ambulatory Visit (INDEPENDENT_AMBULATORY_CARE_PROVIDER_SITE_OTHER): Payer: Medicare HMO | Admitting: Physical Medicine and Rehabilitation

## 2021-08-14 ENCOUNTER — Other Ambulatory Visit: Payer: Self-pay

## 2021-08-14 ENCOUNTER — Encounter: Payer: Self-pay | Admitting: Physical Medicine and Rehabilitation

## 2021-08-14 ENCOUNTER — Ambulatory Visit: Payer: Self-pay

## 2021-08-14 DIAGNOSIS — M25552 Pain in left hip: Secondary | ICD-10-CM

## 2021-08-14 NOTE — Progress Notes (Signed)
Pt state left hip pain. Pt state laying down makes the pain worse. Pt state he takes pain meds to help ease his pain.  Numeric Pain Rating Scale and Functional Assessment Average Pain 8   In the last MONTH (on 0-10 scale) has pain interfered with the following?  1. General activity like being  able to carry out your everyday physical activities such as walking, climbing stairs, carrying groceries, or moving a chair?  Rating(10)   +Driver, -BT, -Dye Allergies.

## 2021-08-20 ENCOUNTER — Ambulatory Visit: Payer: Medicare HMO | Admitting: Physical Medicine and Rehabilitation

## 2021-08-27 MED ORDER — TRIAMCINOLONE ACETONIDE 40 MG/ML IJ SUSP
60.0000 mg | INTRAMUSCULAR | Status: AC | PRN
  Administered 2021-08-14: 60 mg via INTRA_ARTICULAR

## 2021-08-27 MED ORDER — BUPIVACAINE HCL 0.25 % IJ SOLN
4.0000 mL | INTRAMUSCULAR | Status: AC | PRN
  Administered 2021-08-14: 4 mL via INTRA_ARTICULAR

## 2021-08-27 NOTE — Progress Notes (Signed)
   STEVE YOUNGBERG - 65 y.o. male MRN [id number]  Date of birth: 1956/09/01  Office Visit Note: Visit Date: 08/14/2021 PCP: Woodroe Chen, MD Referred by: Woodroe Chen, MD  Subjective: Chief Complaint  Patient presents with   Left Hip - Pain   HPI:  IORI GIGANTE is a 65 y.o. male who comes in today at the request of Dr. Doneen Poisson for planned Right anesthetic hip arthrogram with fluoroscopic guidance.  The patient has failed conservative care including home exercise, medications, time and activity modification.  This injection will be diagnostic and hopefully therapeutic.  Please see requesting physician notes for further details and justification.   ROS Otherwise per HPI.  Assessment & Plan: Visit Diagnoses:    ICD-10-CM   1. Pain in left hip  M25.552 XR C-ARM NO REPORT      Plan: No additional findings.   Meds & Orders: No orders of the defined types were placed in this encounter.   Orders Placed This Encounter  Procedures   Large Joint Inj   XR C-ARM NO REPORT    Follow-up: Return for visit to requesting physician as needed.   Procedures: Large Joint Inj: R hip joint on 08/14/2021 8:30 AM Indications: diagnostic evaluation and pain Details: 22 G 3.5 in needle, fluoroscopy-guided anterior approach  Arthrogram: No  Medications: 4 mL bupivacaine 0.25 %; 60 mg triamcinolone acetonide 40 MG/ML Outcome: tolerated well, no immediate complications  There was excellent flow of contrast producing a partial arthrogram of the hip. The patient did have relief of symptoms during the anesthetic phase of the injection. Procedure, treatment alternatives, risks and benefits explained, specific risks discussed. Consent was given by the patient. Immediately prior to procedure a time out was called to verify the correct patient, procedure, equipment, support staff and site/side marked as required. Patient was prepped and draped in the usual sterile fashion.         Clinical  History: No specialty comments available.     Objective:  VS:  HT:    WT:   BMI:     BP:   HR: bpm  TEMP: ( )  RESP:  Physical Exam   Imaging: No results found.

## 2021-09-04 ENCOUNTER — Ambulatory Visit (INDEPENDENT_AMBULATORY_CARE_PROVIDER_SITE_OTHER): Payer: Medicare HMO | Admitting: Orthopaedic Surgery

## 2021-09-04 ENCOUNTER — Other Ambulatory Visit: Payer: Self-pay

## 2021-09-04 ENCOUNTER — Encounter: Payer: Self-pay | Admitting: Orthopaedic Surgery

## 2021-09-04 DIAGNOSIS — M1612 Unilateral primary osteoarthritis, left hip: Secondary | ICD-10-CM | POA: Diagnosis not present

## 2021-09-04 DIAGNOSIS — M25552 Pain in left hip: Secondary | ICD-10-CM

## 2021-09-04 NOTE — Progress Notes (Signed)
The patient is well-known to me.  He has known osteoarthritis of his left hip.  Actually replaced his right hip remotely.  His left hip is been hurting him for several years now.  We recently sent him for an intra-articular steroid injection under fluoroscopy by Dr. Alvester Morin.  He says that has not really helped much at all.  He still has groin pain.  He is dealing with Parkinson's disease which also affects his gait and his mobility.  His wife is with him today as well.  They are talking about proceeding at this point with hip replacement surgery given the failure conservative treatment.  Having had this done before he is fully aware of what the surgery involves as well as a discussion of the risk and benefits of surgery.  On exam his left hip is very stiff with internal and external rotation.  Some of this is related to his arthritis and some to his Parkinson's disease.  This point we will work on proceeding with scheduling surgery for a left total hip arthroplasty sometime in the next few months.  We will be in touch about scheduling the surgery.  He agrees with this treatment plan as well.  All question concerns were answered and addressed.

## 2021-09-16 ENCOUNTER — Telehealth: Payer: Self-pay | Admitting: Orthopaedic Surgery

## 2021-09-16 NOTE — Telephone Encounter (Signed)
Pt wife called and is wanting to know if her husband is going to get a call for surgery. She said please add him to any cancellation list. Can you please call her.   CB [phone]

## 2021-09-17 NOTE — Telephone Encounter (Signed)
I called patient and scheduled surgery. 

## 2021-09-29 ENCOUNTER — Telehealth: Payer: Self-pay

## 2021-09-29 NOTE — Telephone Encounter (Signed)
Pts wife called stating that the patient had a fall on his hip that he is due to have his operation on. His wife wanted to know if they needed to come in to have an xray done to see if he has done anything that may interfere with surgery in jan

## 2021-10-08 ENCOUNTER — Telehealth: Payer: Self-pay

## 2021-10-08 ENCOUNTER — Other Ambulatory Visit: Payer: Self-pay | Admitting: Orthopaedic Surgery

## 2021-10-08 ENCOUNTER — Telehealth: Payer: Self-pay | Admitting: Orthopaedic Surgery

## 2021-10-08 MED ORDER — HYDROCODONE-ACETAMINOPHEN 5-325 MG PO TABS: 1.0000 | ORAL_TABLET | Freq: Four times a day (QID) | ORAL | 0 refills | Status: DC | PRN

## 2021-10-08 NOTE — Telephone Encounter (Signed)
Please advise 

## 2021-10-08 NOTE — Telephone Encounter (Signed)
Pts wife called into the office stating that since her husband fell he has been in extreme pain. And would like to know if some pain medication can be sent in for him until his surgery date that is on 11/07/21

## 2021-10-09 ENCOUNTER — Other Ambulatory Visit: Payer: Self-pay | Admitting: Orthopaedic Surgery

## 2021-10-09 MED ORDER — OXYCODONE-ACETAMINOPHEN 5-325 MG PO TABS: 1.0000 | ORAL_TABLET | Freq: Three times a day (TID) | ORAL | 0 refills | Status: DC | PRN

## 2021-10-09 NOTE — Telephone Encounter (Signed)
Patient called in requesting a change in pain medication to Oxycodone or Percocet. The prescribed medication gives him headaches and does not help with pain.

## 2021-10-21 NOTE — Progress Notes (Signed)
Surgery orders requested via Epic inbox. °

## 2021-10-27 ENCOUNTER — Other Ambulatory Visit: Payer: Self-pay | Admitting: Physician Assistant

## 2021-10-27 DIAGNOSIS — M1612 Unilateral primary osteoarthritis, left hip: Secondary | ICD-10-CM

## 2021-10-28 ENCOUNTER — Other Ambulatory Visit: Payer: Self-pay

## 2021-10-29 ENCOUNTER — Telehealth: Payer: Self-pay | Admitting: Orthopaedic Surgery

## 2021-10-29 NOTE — Telephone Encounter (Signed)
Pt scheduled tomorrow with Bronson Curb

## 2021-10-29 NOTE — Telephone Encounter (Signed)
Patient called stating he is concerned about his left hip and left knee and wanted to know if Dr Magnus Ivan think he should have another X-rays? Patient said since he fell after he had the first set of X-rays he's pretty concerned?  The number to contact patient is [phone]

## 2021-10-30 ENCOUNTER — Ambulatory Visit (INDEPENDENT_AMBULATORY_CARE_PROVIDER_SITE_OTHER): Payer: Medicare HMO | Admitting: Physician Assistant

## 2021-10-30 ENCOUNTER — Encounter: Payer: Self-pay | Admitting: Physician Assistant

## 2021-10-30 ENCOUNTER — Ambulatory Visit (INDEPENDENT_AMBULATORY_CARE_PROVIDER_SITE_OTHER): Payer: Medicare HMO

## 2021-10-30 ENCOUNTER — Other Ambulatory Visit: Payer: Self-pay

## 2021-10-30 DIAGNOSIS — M79605 Pain in left leg: Secondary | ICD-10-CM

## 2021-10-30 DIAGNOSIS — M1612 Unilateral primary osteoarthritis, left hip: Secondary | ICD-10-CM

## 2021-10-30 DIAGNOSIS — Z96652 Presence of left artificial knee joint: Secondary | ICD-10-CM

## 2021-10-30 NOTE — Progress Notes (Signed)
Office Visit Note   Patient: Sean Tyler           Date of Birth: 1956-03-17           MRN: [id number] Visit Date: 10/30/2021              Requested by: Marjory Sneddon, MD [address] [place],  [place] [address] PCP: Marjory Sneddon, MD   Assessment & Plan: Visit Diagnoses:  1. Pain in left leg     Plan: We will proceed with left total hip arthroplasty on 09/07/2022 as scheduled.  Radiographs were reviewed with the patient today.  Questions were encouraged and answered at length.  Follow-Up Instructions: Return for post op.   Orders:  Orders Placed This Encounter  Procedures   XR Knee 1-2 Views Left   XR HIP UNILAT W OR W/O PELVIS 2-3 VIEWS LEFT   No orders of the defined types were placed in this encounter.     Procedures: No procedures performed   Clinical Data: No additional findings.   Subjective: Chief Complaint  Patient presents with   Other    Fall since last OV-wants hip and knee xray to make sure he has not made anything worse before his hip surgery that is scheduled for 11/07/21    HPI Mr. Norquist 66 year old male well-known by department service scheduled for left total hip arthroplasty 11/07/2021.  He is fallen since his last office visit again he does have Parkinson's disease and falls occasionally.  He feels as if his knee may have been injured during the fall.  He has a history of left total knee arthroplasty.  He is wanting radiographs of his left hip knee.  He is ambulating without any assistive device today. Review of Systems See HPI otherwise negative  Objective: Vital Signs: There were no vitals taken for this visit.  Physical Exam General well-developed well-nourished male no acute distress. Ortho Exam Left hip limited external and internal rotation with pain.  Nontender over the left trochanteric region.  Left knee well-healed surgical incision.  Full extension full flexion left knee.  No instability valgus varus stressing.  Anterior  drawer is negative.  No gross abnormality involving the left knee. Specialty Comments:  No specialty comments available.  Imaging: XR HIP UNILAT W OR W/O PELVIS 2-3 VIEWS LEFT  Result Date: 10/30/2021 AP pelvis lateral view left hip: No acute fractures.  Both hips well located.  Moderate to moderately near degenerative changes left hip.  Status post right total hip arthroplasty with well-seated components.  XR Knee 1-2 Views Left  Result Date: 10/30/2021 Left knee 2 views: No acute fracture.  Knee is well located.  Status post left total knee arthroplasty with well-seated components.    PMFS History: Patient Active Problem List   Diagnosis Date Noted   Trochanteric bursitis, left hip 11/16/2017   Unilateral primary osteoarthritis, left hip 11/16/2017   History of total right hip replacement 11/16/2017   Arthritis of knee, left 06/16/2012   Past Medical History:  Diagnosis Date   Arthritis    Deep vein blood clot of right lower extremity ([place]) 2009   Enlarged prostate    trouble with urine stream   GERD (gastroesophageal reflux disease)    occasional    Insomnia    Parkinson's disease [facility]) feb 2011   dr haq at [facility] neurology   Sleep apnea    does not know cpap settings    No family history on file.  Past Surgical History:  Procedure Laterality Date   benign tumor removed behind right ear  2004   left knee arthroscopy  2011   left knee arthroscopy  2012   NASAL SINUS SURGERY  several yrs ago   right total hip replacment     nov 2011   TOTAL KNEE ARTHROPLASTY  06/16/2012   Procedure: TOTAL KNEE ARTHROPLASTY;  Surgeon: Mcarthur Rossetti, MD;  Location: [facility];  Service: Orthopedics;  Laterality: Left;   Social History   Occupational History   Not on file  Tobacco Use   Smoking status: Never   Smokeless tobacco: Never  Substance and Sexual Activity   Alcohol use: Yes    Alcohol/week: 7.0 standard drinks    Types: 7 Glasses of wine per week   Drug use: No    Sexual activity: Not on file

## 2021-10-30 NOTE — Patient Instructions (Signed)
DUE TO COVID-19 ONLY ONE VISITOR IS ALLOWED TO COME WITH YOU AND STAY IN THE WAITING ROOM ONLY DURING PRE OP AND PROCEDURE.   **NO VISITORS ARE ALLOWED IN THE SHORT STAY AREA OR RECOVERY ROOM!!**  IF YOU WILL BE ADMITTED INTO THE HOSPITAL YOU ARE ALLOWED ONLY TWO SUPPORT PEOPLE DURING VISITATION HOURS ONLY (7 AM -8PM)   The support person(s) must pass our screening, gel in and out, and wear a mask at all times, including in the patients room. Patients must also wear a mask when staff or their support person are in the room. Visitors GUEST BADGE MUST BE WORN VISIBLY  One adult visitor may remain with you overnight and MUST be in the room by 8 P.M.  No visitors under the age of 53. Any visitor under the age of 67 must be accompanied by an adult.    COVID SWAB TESTING MUST BE COMPLETED ON: 11/05/21 @ 12:15 PM    Site: [facility] [address] [place] Enter: Main Entrance have a seat in the waiting area to the right of main entrance (DO NOT CHECK IN AT ADMITTING!!!!!) Dial: [phone] to alert staff you have arrived  You are not required to quarantine, however you are required to wear a well-fitted mask when you are out and around people not in your household.  Hand Hygiene often Do NOT share personal items Notify your provider if you are in close contact with someone who has COVID or you develop fever 100.4 or greater, new onset of sneezing, cough, sore throat, shortness of breath or body aches.  [facility] Entrance [address], [address], must go inside of the hospital, NOT A DRIVE THRU!  (Must self quarantine after testing. Follow instructions on handout.)       Your procedure is scheduled on: 11/07/21   Report to [facility] Main Entrance    Report to admitting at: 8:15 AM   Call this number if you have problems the morning of surgery [phone]   Do not eat food :After Midnight.   May have liquids until  : 8:00 AM   day of surgery  CLEAR LIQUID DIET  Foods Allowed                                                                     Foods Excluded  Water, Black Coffee and tea, regular and decaf                             liquids that you cannot  Plain Jell-O in any flavor  (No red)                                           see through such as: Fruit ices (not with fruit pulp)                                     milk, soups, orange juice  Iced Popsicles (No red)                                    All solid food                                   Apple juices Sports drinks like Gatorade (No red) Lightly seasoned clear broth or consume(fat free) Sugar  Sample Menu Breakfast                                Lunch                                     Supper Cranberry juice                    Beef broth                            Chicken broth Jell-O                                     Grape juice                           Apple juice Coffee or tea                        Jell-O                                      Popsicle                                                Coffee or tea                        Coffee or tea     Complete one Ensure drink the morning of surgery at : 8:00 AM      the day of surgery.    The day of surgery:  Drink ONE (1) Pre-Surgery Clear Ensure or G2 by am the morning of surgery. Drink in one sitting. Do not sip.  This drink was given to you during your hospital  pre-op appointment visit. Nothing else to drink after completing the  Pre-Surgery Clear Ensure or G2.          If you have questions, please contact your surgeons office.     Oral Hygiene is also important to reduce your risk of infection.                                    Remember - BRUSH YOUR TEETH THE MORNING OF SURGERY WITH YOUR REGULAR TOOTHPASTE   Do NOT smoke after Midnight   Take these medicines the  morning of surgery with A SIP OF WATER: paroxetine,carbidopa.Diazepam as  needed.  DO NOT TAKE ANY ORAL DIABETIC MEDICATIONS DAY OF YOUR SURGERY                              You may not have any metal on your body including hair pins, jewelry, and body piercing             Do not wear lotions, powders, perfumes/cologne, or deodorant              Men may shave face and neck.   Do not bring valuables to the hospital. [facility] IS NOT             RESPONSIBLE   FOR VALUABLES.   Contacts, dentures or bridgework may not be worn into surgery.   Bring small overnight bag day of surgery.    Patients discharged on the day of surgery will not be allowed to drive home.  We recommend you have a responsible adult to stay with you for the first 24 hours after anesthesia.   Special Instructions: Bring a copy of your healthcare power of attorney and living will documents         the day of surgery if you haven't scanned them before.              Please read over the following fact sheets you were given: IF YOU HAVE QUESTIONS ABOUT YOUR PRE-OP INSTRUCTIONS PLEASE CALL [phone]     [facility] - Preparing for Surgery Before surgery, you can play an important role.  Because skin is not sterile, your skin needs to be as free of germs as possible.  You can reduce the number of germs on your skin by washing with CHG (chlorahexidine gluconate) soap before surgery.  CHG is an antiseptic cleaner which kills germs and bonds with the skin to continue killing germs even after washing. Please DO NOT use if you have an allergy to CHG or antibacterial soaps.  If your skin becomes reddened/irritated stop using the CHG and inform your nurse when you arrive at Short Stay. Do not shave (including legs and underarms) for at least 48 hours prior to the first CHG shower.  You may shave your face/neck. Please follow these instructions carefully:  1.  Shower with CHG Soap the night before surgery and the  morning of Surgery.  2.  If you choose to wash your hair, wash your hair first as usual  with your  normal  shampoo.  3.  After you shampoo, rinse your hair and body thoroughly to remove the  shampoo.                           4.  Use CHG as you would any other liquid soap.  You can apply chg directly  to the skin and wash                       Gently with a scrungie or clean washcloth.  5.  Apply the CHG Soap to your body ONLY FROM THE NECK DOWN.   Do not use on face/ open                           Wound or open sores. Avoid contact with eyes, ears mouth and genitals (private  parts).                       Wash face,  Genitals (private parts) with your normal soap.             6.  Wash thoroughly, paying special attention to the area where your surgery  will be performed.  7.  Thoroughly rinse your body with warm water from the neck down.  8.  DO NOT shower/wash with your normal soap after using and rinsing off  the CHG Soap.                9.  Pat yourself dry with a clean towel.            10.  Wear clean pajamas.            11.  Place clean sheets on your bed the night of your first shower and do not  sleep with pets. Day of Surgery : Do not apply any lotions/deodorants the morning of surgery.  Please wear clean clothes to the hospital/surgery center.  FAILURE TO FOLLOW THESE INSTRUCTIONS MAY RESULT IN THE CANCELLATION OF YOUR SURGERY PATIENT SIGNATURE_________________________________  NURSE SIGNATURE__________________________________  ________________________________________________________________________   Sean Tyler  An incentive spirometer is a tool that can help keep your lungs clear and active. This tool measures how well you are filling your lungs with each breath. Taking long deep breaths may help reverse or decrease the chance of developing breathing (pulmonary) problems (especially infection) following: A long period of time when you are unable to move or be active. BEFORE THE PROCEDURE  If the spirometer includes an indicator to show your best effort,  your nurse or respiratory therapist will set it to a desired goal. If possible, sit up straight or lean slightly forward. Try not to slouch. Hold the incentive spirometer in an upright position. INSTRUCTIONS FOR USE  Sit on the edge of your bed if possible, or sit up as far as you can in bed or on a chair. Hold the incentive spirometer in an upright position. Breathe out normally. Place the mouthpiece in your mouth and seal your lips tightly around it. Breathe in slowly and as deeply as possible, raising the piston or the ball toward the top of the column. Hold your breath for 3-5 seconds or for as long as possible. Allow the piston or ball to fall to the bottom of the column. Remove the mouthpiece from your mouth and breathe out normally. Rest for a few seconds and repeat Steps 1 through 7 at least 10 times every 1-2 hours when you are awake. Take your time and take a few normal breaths between deep breaths. The spirometer may include an indicator to show your best effort. Use the indicator as a goal to work toward during each repetition. After each set of 10 deep breaths, practice coughing to be sure your lungs are clear. If you have an incision (the cut made at the time of surgery), support your incision when coughing by placing a pillow or rolled up towels firmly against it. Once you are able to get out of bed, walk around indoors and cough well. You may stop using the incentive spirometer when instructed by your caregiver.  RISKS AND COMPLICATIONS Take your time so you do not get dizzy or light-headed. If you are in pain, you may need to take or ask for pain medication before doing incentive spirometry. It is harder to take a  deep breath if you are having pain. AFTER USE Rest and breathe slowly and easily. It can be helpful to keep track of a log of your progress. Your caregiver can provide you with a simple table to help with this. If you are using the spirometer at home, follow these  instructions: SEEK MEDICAL CARE IF:  You are having difficultly using the spirometer. You have trouble using the spirometer as often as instructed. Your pain medication is not giving enough relief while using the spirometer. You develop fever of 100.5 F (38.1 C) or higher. SEEK IMMEDIATE MEDICAL CARE IF:  You cough up bloody sputum that had not been present before. You develop fever of 102 F (38.9 C) or greater. You develop worsening pain at or near the incision site. MAKE SURE YOU:  Understand these instructions. Will watch your condition. Will get help right away if you are not doing well or get worse. Document Released: 02/22/2007 Document Revised: 01/04/2012 Document Reviewed: 04/25/2007 [facility] Patient Information 2014 [employer], [place].   ________________________________________________________________________

## 2021-10-31 ENCOUNTER — Other Ambulatory Visit: Payer: Self-pay

## 2021-10-31 ENCOUNTER — Encounter (HOSPITAL_COMMUNITY): Payer: Self-pay

## 2021-10-31 ENCOUNTER — Encounter (HOSPITAL_COMMUNITY)
Admission: RE | Admit: 2021-10-31 | Discharge: 2021-10-31 | Disposition: A | Discharge status: Home / Self Care | Payer: Medicare HMO | Source: Ambulatory Visit | Attending: Orthopaedic Surgery | Admitting: Orthopaedic Surgery

## 2021-10-31 VITALS — BP 111/80 | HR 82 | Temp 97.6°F | Ht 76.0 in | Wt 223.0 lb

## 2021-10-31 DIAGNOSIS — Z01812 Encounter for preprocedural laboratory examination: Secondary | ICD-10-CM | POA: Insufficient documentation

## 2021-10-31 DIAGNOSIS — M1612 Unilateral primary osteoarthritis, left hip: Secondary | ICD-10-CM | POA: Insufficient documentation

## 2021-10-31 DIAGNOSIS — I2510 Atherosclerotic heart disease of native coronary artery without angina pectoris: Secondary | ICD-10-CM | POA: Insufficient documentation

## 2021-10-31 DIAGNOSIS — Z01818 Encounter for other preprocedural examination: Secondary | ICD-10-CM

## 2021-10-31 HISTORY — DX: Peripheral vascular disease, unspecified: I73.9

## 2021-10-31 HISTORY — DX: Anxiety disorder, unspecified: F41.9

## 2021-10-31 HISTORY — DX: Depression, unspecified: F32.A

## 2021-10-31 LAB — CBC
HCT: 49.8 % (ref 39.0–52.0)
Hemoglobin: 16.9 g/dL (ref 13.0–17.0)
MCH: 32 pg (ref 26.0–34.0)
MCHC: 33.9 g/dL (ref 30.0–36.0)
MCV: 94.3 fL (ref 80.0–100.0)
Platelets: 238 10*3/uL (ref 150–400)
RBC: 5.28 MIL/uL (ref 4.22–5.81)
RDW: 11.9 % (ref 11.5–15.5)
WBC: 5.6 10*3/uL (ref 4.0–10.5)
nRBC: 0 % (ref 0.0–0.2)

## 2021-10-31 LAB — SURGICAL PCR SCREEN
MRSA, PCR: NEGATIVE
Staphylococcus aureus: POSITIVE — AB

## 2021-10-31 LAB — BASIC METABOLIC PANEL
Anion gap: 5 (ref 5–15)
BUN: 12 mg/dL (ref 8–23)
CO2: 23 mmol/L (ref 22–32)
Calcium: 8.4 mg/dL — ABNORMAL LOW (ref 8.9–10.3)
Chloride: 107 mmol/L (ref 98–111)
Creatinine, Ser: 0.93 mg/dL (ref 0.61–1.24)
GFR, Estimated: >60 mL/min (ref >60)
Glucose, Bld: 113 mg/dL — ABNORMAL HIGH (ref 70–99)
Potassium: 4 mmol/L (ref 3.5–5.1)
Sodium: 135 mmol/L (ref 135–145)

## 2021-10-31 NOTE — Progress Notes (Signed)
PCR: + STAPH °

## 2021-10-31 NOTE — Progress Notes (Signed)
COVID Vaccine Completed: Yes Date COVID Vaccine completed: 03/07/20 x 2 COVID vaccine manufacturer: Pfizer     COVID Test: 11/05/21 PCP - Dr. Marjory Sneddon. LOV: 09/05/21 Cardiologist -   Chest x-ray -  EKG - 04/02/21: CEW Stress Test -  ECHO -  Cardiac Cath -  Pacemaker/ICD device last checked:  Sleep Study - Yes CPAP - NO  Fasting Blood Sugar -  Checks Blood Sugar _____ times a day  Blood Thinner Instructions: Aspirin Instructions: Last Dose:  Anesthesia review: Hx: DVT,OSA(NO CPAP),Brain stimulator.  Patient denies shortness of breath, fever, cough and chest pain at PAT appointment   Patient verbalized understanding of instructions that were given to them at the PAT appointment. Patient was also instructed that they will need to review over the PAT instructions again at home before surgery.

## 2021-11-05 ENCOUNTER — Other Ambulatory Visit: Payer: Self-pay

## 2021-11-05 ENCOUNTER — Encounter (HOSPITAL_COMMUNITY)
Admission: RE | Admit: 2021-11-05 | Discharge: 2021-11-05 | Disposition: A | Discharge status: Home / Self Care | Payer: Medicare HMO | Source: Ambulatory Visit | Attending: Orthopaedic Surgery | Admitting: Orthopaedic Surgery

## 2021-11-05 DIAGNOSIS — Z20822 Contact with and (suspected) exposure to covid-19: Secondary | ICD-10-CM | POA: Diagnosis not present

## 2021-11-05 DIAGNOSIS — Z01812 Encounter for preprocedural laboratory examination: Secondary | ICD-10-CM | POA: Insufficient documentation

## 2021-11-05 DIAGNOSIS — Z01818 Encounter for other preprocedural examination: Secondary | ICD-10-CM

## 2021-11-05 LAB — SARS CORONAVIRUS 2 (TAT 6-24 HRS): SARS Coronavirus 2: NEGATIVE

## 2021-11-06 ENCOUNTER — Encounter (HOSPITAL_COMMUNITY): Payer: Self-pay | Admitting: Orthopaedic Surgery

## 2021-11-06 NOTE — H&P (Signed)
TOTAL HIP ADMISSION H&P  Patient is admitted for left total hip arthroplasty.  Subjective:  Chief Complaint: left hip pain  HPI: Sean Tyler, 66 y.o. male, has a history of pain and functional disability in the left hip(s) due to arthritis and patient has failed non-surgical conservative treatments for greater than 12 weeks to include NSAID's and/or analgesics, flexibility and strengthening excercises, use of assistive devices, and activity modification.  Onset of symptoms was gradual starting 2 years ago with gradually worsening course since that time.The patient noted no past surgery on the left hip(s).  Patient currently rates pain in the left hip at 10 out of 10 with activity. Patient has night pain, worsening of pain with activity and weight bearing, pain that interfers with activities of daily living, and pain with passive range of motion. Patient has evidence of subchondral cysts, subchondral sclerosis, periarticular osteophytes, and joint space narrowing by imaging studies. This condition presents safety issues increasing the risk of falls.  There is no current active infection.  Patient Active Problem List   Diagnosis Date Noted   Trochanteric bursitis, left hip 11/16/2017   Unilateral primary osteoarthritis, left hip 11/16/2017   History of total right hip replacement 11/16/2017   Arthritis of knee, left 06/16/2012   Past Medical History:  Diagnosis Date   Anxiety    Arthritis    Deep vein blood clot of right lower extremity ([facility]) 10/27/2007   Depression    Enlarged prostate    trouble with urine stream   GERD (gastroesophageal reflux disease)    occasional    Insomnia    Parkinson's disease ([facility]) 11/26/2009   dr haq at [facility]   Peripheral vascular disease [facility])    Sleep apnea    does not know cpap settings    Past Surgical History:  Procedure Laterality Date   benign tumor removed behind right ear  2004   left knee arthroscopy  2011   left knee  arthroscopy  2012   NASAL SINUS SURGERY  several yrs ago   right total hip replacment     nov 2011   TOTAL KNEE ARTHROPLASTY  06/16/2012   Procedure: TOTAL KNEE ARTHROPLASTY;  Surgeon: Kathryne Hitch, MD;  Location: [facility];  Service: Orthopedics;  Laterality: Left;    No current facility-administered medications for this encounter.   Current Outpatient Medications  Medication Sig Dispense Refill Last Dose   APPLE CIDER VINEGAR PO Take 3,600 mg by mouth 3 (three) times daily. 1200 mg      Bacillus Coagulans-Inulin (PROBIOTIC-PREBIOTIC PO) Take 1 tablet by mouth daily.      calcium carbonate (TITRALAC) 420 MG CHEW Chew 420 mg by mouth daily as needed for indigestion or heartburn.      carbidopa-levodopa (SINEMET IR) 25-100 MG per tablet Take 1-2 tablets by mouth daily as needed (to fill in the gaps).      Carbidopa-Levodopa ER (RYTARY) 36.25-145 MG CPCR Take 1 tablet by mouth 4 (four) times daily.      Carbidopa-Levodopa ER (RYTARY) 61.25-245 MG CPCR Take 2 tablets by mouth 4 (four) times daily.      Cholecalciferol (VITAMIN D3 PO) Take 1 tablet by mouth daily.      diazepam (VALIUM) 5 MG tablet Take 5 mg by mouth every 12 (twelve) hours as needed for anxiety.      fluticasone (FLONASE) 50 MCG/ACT nasal spray Place 1 spray into both nostrils at bedtime.      HYDROcodone-acetaminophen (NORCO/VICODIN) 5-325 MG tablet  TAKE 1 TABLET BY MOUTH EVERY 6 HOURS AS NEEDED FOR moderate PAIN 30 tablet 0    oxyCODONE-acetaminophen (PERCOCET/ROXICET) 5-325 MG tablet Take 1 tablet by mouth every 8 (eight) hours as needed. For pain (Patient taking differently: Take 1 tablet by mouth every 8 (eight) hours as needed for moderate pain or severe pain.) 30 tablet 0    oxymetazoline (AFRIN) 0.05 % nasal spray Place 2 sprays into the nose 2 (two) times daily as needed.      PARoxetine (PAXIL) 30 MG tablet Take 30 mg by mouth daily.      Phenyleph-Doxylamine-DM-APAP (ALKA SELTZER PLUS PO) Take 1 tablet by mouth  daily.      PROTEIN PO Take 6 oz by mouth daily. Booster 30 G protein 1 G sugar      valACYclovir (VALTREX) 1000 MG tablet Take 500 mg by mouth daily as needed (flare-up).      Allergies  Allergen Reactions   Bacitracin Itching   Bacitracin-Neomycin-Polymyxin Itching   Erythromycin Itching   Vancomycin Itching and Rash   Hydrocodone-Acetaminophen Nausea And Vomiting    Headache     Sulfa Antibiotics Rash    Only some, not all     Codeine Nausea And Vomiting    Social History   Tobacco Use   Smoking status: Never   Smokeless tobacco: Never  Substance Use Topics   Alcohol use: Not Currently    Alcohol/week: 7.0 standard drinks    Types: 7 Glasses of wine per week    History reviewed. No pertinent family history.   Review of Systems  Musculoskeletal:  Positive for gait problem.  Neurological:  Positive for weakness.  All other systems reviewed and are negative.  Objective:  Physical Exam Vitals reviewed.  Constitutional:      Appearance: Normal appearance.  HENT:     Head: Normocephalic and atraumatic.  Eyes:     Extraocular Movements: Extraocular movements intact.     Pupils: Pupils are equal, round, and reactive to light.  Cardiovascular:     Rate and Rhythm: Normal rate and regular rhythm.     Pulses: Normal pulses.  Pulmonary:     Effort: Pulmonary effort is normal.     Breath sounds: Normal breath sounds.  Abdominal:     Palpations: Abdomen is soft.  Musculoskeletal:     Cervical back: Normal range of motion and neck supple.     Left hip: Tenderness and bony tenderness present. Decreased range of motion. Decreased strength.  Neurological:     Mental Status: He is alert and oriented to person, place, and time.  Psychiatric:        Behavior: Behavior normal.    Vital signs in last 24 hours:    Labs:   Estimated body mass index is 27.14 kg/m as calculated from the following:   Height as of 10/31/21: 6\' 4"  (1.93 m).   Weight as of 10/31/21: 101.2  kg.   Imaging Review Plain radiographs demonstrate severe degenerative joint disease of the left hip(s). The bone quality appears to be good for age and reported activity level.      Assessment/Plan:  End stage arthritis, left hip(s)  The patient history, physical examination, clinical judgement of the provider and imaging studies are consistent with end stage degenerative joint disease of the left hip(s) and total hip arthroplasty is deemed medically necessary. The treatment options including medical management, injection therapy, arthroscopy and arthroplasty were discussed at length. The risks and benefits of total hip arthroplasty  were presented and reviewed. The risks due to aseptic loosening, infection, stiffness, dislocation/subluxation,  thromboembolic complications and other imponderables were discussed.  The patient acknowledged the explanation, agreed to proceed with the plan and consent was signed. Patient is being admitted for inpatient treatment for surgery, pain control, PT, OT, prophylactic antibiotics, VTE prophylaxis, progressive ambulation and ADL's and discharge planning.The patient is planning to be discharged home with home health services

## 2021-11-06 NOTE — Anesthesia Preprocedure Evaluation (Addendum)
Anesthesia Evaluation  Patient identified by MRN, date of birth, ID band Patient awake    Reviewed: Allergy & Precautions, NPO status , Patient's Chart, lab work & pertinent test results  Airway Mallampati: III  TM Distance: >3 FB   Mouth opening: Limited Mouth Opening  Dental no notable dental hx. (+) Teeth Intact, Dental Advisory Given   Pulmonary sleep apnea ,  Not using CPAP any longer since weight loss   Pulmonary exam normal breath sounds clear to auscultation       Cardiovascular + Peripheral Vascular Disease and + DVT  Normal cardiovascular exam Rhythm:Regular Rate:Normal  Hx/o DVT RLE 2009  EKG   Neuro/Psych PSYCHIATRIC DISORDERS Anxiety Depression Parkinson's disease with EBS    GI/Hepatic Neg liver ROS, GERD  Medicated and Controlled,Oropharyngeal dysphagia   Endo/Other    Renal/GU negative Renal ROS   BPH S/P TURP    Musculoskeletal  (+) Arthritis , Osteoarthritis,  DJD left hip   Abdominal   Peds  Hematology negative hematology ROS (+)   Anesthesia Other Findings   Reproductive/Obstetrics ED                           Anesthesia Physical Anesthesia Plan  ASA: 3  Anesthesia Plan: Spinal   Post-op Pain Management:    Induction: Intravenous  PONV Risk Score and Plan: Treatment may vary due to age or medical condition and Propofol infusion  Airway Management Planned: Natural Airway and Simple Face Mask  Additional Equipment:   Intra-op Plan:   Post-operative Plan: Extubation in OR  Informed Consent: I have reviewed the patients History and Physical, chart, labs and discussed the procedure including the risks, benefits and alternatives for the proposed anesthesia with the patient or authorized representative who has indicated his/her understanding and acceptance.     Dental advisory given  Plan Discussed with: CRNA and Anesthesiologist  Anesthesia Plan  Comments:        Anesthesia Quick Evaluation

## 2021-11-07 ENCOUNTER — Observation Stay (HOSPITAL_COMMUNITY): Payer: Medicare HMO

## 2021-11-07 ENCOUNTER — Ambulatory Visit (HOSPITAL_COMMUNITY): Payer: Medicare HMO | Admitting: Physician Assistant

## 2021-11-07 ENCOUNTER — Encounter (HOSPITAL_COMMUNITY): Payer: Self-pay | Admitting: Orthopaedic Surgery

## 2021-11-07 ENCOUNTER — Ambulatory Visit (HOSPITAL_COMMUNITY): Payer: Medicare HMO

## 2021-11-07 ENCOUNTER — Observation Stay (HOSPITAL_COMMUNITY)
Admission: RE | Admit: 2021-11-07 | Discharge: 2021-11-08 | Disposition: A | Discharge status: Home / Self Care | Payer: Medicare HMO | Source: Ambulatory Visit | Attending: Orthopaedic Surgery | Admitting: Orthopaedic Surgery

## 2021-11-07 ENCOUNTER — Encounter (HOSPITAL_COMMUNITY)
Admission: RE | Disposition: A | Discharge status: Home / Self Care | Payer: Self-pay | Source: Ambulatory Visit | Attending: Orthopaedic Surgery

## 2021-11-07 ENCOUNTER — Other Ambulatory Visit: Payer: Self-pay

## 2021-11-07 DIAGNOSIS — Z79899 Other long term (current) drug therapy: Secondary | ICD-10-CM | POA: Insufficient documentation

## 2021-11-07 DIAGNOSIS — Z96642 Presence of left artificial hip joint: Secondary | ICD-10-CM

## 2021-11-07 DIAGNOSIS — Z96641 Presence of right artificial hip joint: Secondary | ICD-10-CM | POA: Diagnosis not present

## 2021-11-07 DIAGNOSIS — G20 Parkinson's disease: Secondary | ICD-10-CM | POA: Diagnosis not present

## 2021-11-07 DIAGNOSIS — M1612 Unilateral primary osteoarthritis, left hip: Principal | ICD-10-CM | POA: Insufficient documentation

## 2021-11-07 DIAGNOSIS — S82122A Displaced fracture of lateral condyle of left tibia, initial encounter for closed fracture: Secondary | ICD-10-CM

## 2021-11-07 HISTORY — PX: TOTAL HIP ARTHROPLASTY: SHX124

## 2021-11-07 LAB — TYPE AND SCREEN
ABO/RH(D): O POS
Antibody Screen: NEGATIVE

## 2021-11-07 SURGERY — ARTHROPLASTY, HIP, TOTAL, ANTERIOR APPROACH
Anesthesia: Spinal | Site: Hip | Laterality: Left

## 2021-11-07 MED ORDER — STERILE WATER FOR IRRIGATION IR SOLN
Status: DC | PRN
  Administered 2021-11-07: 2000 mL

## 2021-11-07 MED ORDER — PROPOFOL 10 MG/ML IV BOLUS
INTRAVENOUS | Status: DC | PRN
  Administered 2021-11-07: 20 mg via INTRAVENOUS
  Administered 2021-11-07: 30 mg via INTRAVENOUS

## 2021-11-07 MED ORDER — ASPIRIN 81 MG PO CHEW
81.0000 mg | CHEWABLE_TABLET | Freq: Two times a day (BID) | ORAL | Status: DC
  Administered 2021-11-07 – 2021-11-08 (×2): 81 mg via ORAL
  Filled 2021-11-07 (×2): qty 1

## 2021-11-07 MED ORDER — CARBIDOPA-LEVODOPA ER 36.25-145 MG PO CPCR
1.0000 | ORAL_CAPSULE | Freq: Four times a day (QID) | ORAL | Status: DC
  Administered 2021-11-07 – 2021-11-08 (×4): 1 via ORAL

## 2021-11-07 MED ORDER — DOCUSATE SODIUM 100 MG PO CAPS
100.0000 mg | ORAL_CAPSULE | Freq: Two times a day (BID) | ORAL | Status: DC
  Administered 2021-11-07 – 2021-11-08 (×2): 100 mg via ORAL
  Filled 2021-11-07 (×2): qty 1

## 2021-11-07 MED ORDER — OXYCODONE HCL 5 MG PO TABS: 5.0000 mg | ORAL_TABLET | Freq: Once | ORAL | Status: DC | PRN

## 2021-11-07 MED ORDER — PROPOFOL 10 MG/ML IV BOLUS
INTRAVENOUS | Status: AC
  Filled 2021-11-07: qty 20

## 2021-11-07 MED ORDER — ONDANSETRON HCL 4 MG/2ML IJ SOLN: 4.0000 mg | Freq: Four times a day (QID) | INTRAMUSCULAR | Status: DC | PRN

## 2021-11-07 MED ORDER — PHENYLEPHRINE HCL-NACL 20-0.9 MG/250ML-% IV SOLN
INTRAVENOUS | Status: DC | PRN
  Administered 2021-11-07: 10 ug/min via INTRAVENOUS

## 2021-11-07 MED ORDER — ALBUMIN HUMAN 5 % IV SOLN
INTRAVENOUS | Status: DC | PRN
Start: 2021-11-07 — End: 2021-11-07

## 2021-11-07 MED ORDER — METOCLOPRAMIDE HCL 5 MG/ML IJ SOLN: 5.0000 mg | Freq: Three times a day (TID) | INTRAMUSCULAR | Status: DC | PRN

## 2021-11-07 MED ORDER — METHOCARBAMOL 500 MG PO TABS
500.0000 mg | ORAL_TABLET | Freq: Four times a day (QID) | ORAL | Status: DC | PRN
  Administered 2021-11-07 – 2021-11-08 (×3): 500 mg via ORAL
  Filled 2021-11-07 (×3): qty 1

## 2021-11-07 MED ORDER — MENTHOL 3 MG MT LOZG: 1.0000 | LOZENGE | OROMUCOSAL | Status: DC | PRN

## 2021-11-07 MED ORDER — PHENOL 1.4 % MT LIQD: 1.0000 | OROMUCOSAL | Status: DC | PRN

## 2021-11-07 MED ORDER — 0.9 % SODIUM CHLORIDE (POUR BTL) OPTIME
TOPICAL | Status: DC | PRN
  Administered 2021-11-07: 1000 mL

## 2021-11-07 MED ORDER — ONDANSETRON HCL 4 MG/2ML IJ SOLN
INTRAMUSCULAR | Status: DC | PRN
  Administered 2021-11-07: 4 mg via INTRAVENOUS

## 2021-11-07 MED ORDER — PHENYLEPHRINE HCL (PRESSORS) 10 MG/ML IV SOLN
INTRAVENOUS | Status: DC | PRN
  Administered 2021-11-07: 120 ug via INTRAVENOUS
  Administered 2021-11-07 (×2): 80 ug via INTRAVENOUS

## 2021-11-07 MED ORDER — FENTANYL CITRATE (PF) 100 MCG/2ML IJ SOLN
INTRAMUSCULAR | Status: DC | PRN
  Administered 2021-11-07: 50 ug via INTRAVENOUS

## 2021-11-07 MED ORDER — CEFAZOLIN SODIUM-DEXTROSE 1-4 GM/50ML-% IV SOLN
1.0000 g | Freq: Four times a day (QID) | INTRAVENOUS | Status: AC
  Administered 2021-11-07 (×2): 1 g via INTRAVENOUS
  Filled 2021-11-07 (×2): qty 50

## 2021-11-07 MED ORDER — CHLORHEXIDINE GLUCONATE 0.12 % MT SOLN
15.0000 mL | Freq: Once | OROMUCOSAL | Status: AC
  Administered 2021-11-07: 15 mL via OROMUCOSAL

## 2021-11-07 MED ORDER — POVIDONE-IODINE 10 % EX SWAB
2.0000 "application " | Freq: Once | CUTANEOUS | Status: AC
  Administered 2021-11-07: 2 via TOPICAL

## 2021-11-07 MED ORDER — ACETAMINOPHEN 325 MG PO TABS
325.0000 mg | ORAL_TABLET | Freq: Four times a day (QID) | ORAL | Status: DC | PRN
  Filled 2021-11-07: qty 2

## 2021-11-07 MED ORDER — TRANEXAMIC ACID 1000 MG/10ML IV SOLN
2000.0000 mg | Freq: Once | INTRAVENOUS | Status: AC
  Administered 2021-11-07: 2000 mg via TOPICAL
  Filled 2021-11-07: qty 20

## 2021-11-07 MED ORDER — FLUTICASONE PROPIONATE 50 MCG/ACT NA SUSP
1.0000 | Freq: Every day | NASAL | Status: DC
  Filled 2021-11-07: qty 16

## 2021-11-07 MED ORDER — PHENYLEPHRINE HCL (PRESSORS) 10 MG/ML IV SOLN
INTRAVENOUS | Status: AC
  Filled 2021-11-07: qty 1

## 2021-11-07 MED ORDER — LACTATED RINGERS IV SOLN: INTRAVENOUS | Status: DC

## 2021-11-07 MED ORDER — METOCLOPRAMIDE HCL 5 MG PO TABS: 5.0000 mg | ORAL_TABLET | Freq: Three times a day (TID) | ORAL | Status: DC | PRN

## 2021-11-07 MED ORDER — DEXAMETHASONE SODIUM PHOSPHATE 10 MG/ML IJ SOLN
INTRAMUSCULAR | Status: DC | PRN
  Administered 2021-11-07: 10 mg via INTRAVENOUS

## 2021-11-07 MED ORDER — PAROXETINE HCL 20 MG PO TABS
30.0000 mg | ORAL_TABLET | Freq: Every day | ORAL | Status: DC
  Administered 2021-11-07 – 2021-11-08 (×2): 30 mg via ORAL
  Filled 2021-11-07 (×3): qty 1

## 2021-11-07 MED ORDER — PROPOFOL 500 MG/50ML IV EMUL
INTRAVENOUS | Status: DC | PRN
  Administered 2021-11-07: 100 ug/kg/min via INTRAVENOUS

## 2021-11-07 MED ORDER — PANTOPRAZOLE SODIUM 40 MG PO TBEC
40.0000 mg | DELAYED_RELEASE_TABLET | Freq: Every day | ORAL | Status: DC
  Administered 2021-11-07 – 2021-11-08 (×2): 40 mg via ORAL
  Filled 2021-11-07 (×2): qty 1

## 2021-11-07 MED ORDER — CALCIUM CARBONATE ANTACID 500 MG PO CHEW: 500.0000 mg | CHEWABLE_TABLET | Freq: Every day | ORAL | Status: DC | PRN

## 2021-11-07 MED ORDER — TRANEXAMIC ACID-NACL 1000-0.7 MG/100ML-% IV SOLN
1000.0000 mg | INTRAVENOUS | Status: AC
  Administered 2021-11-07: 1000 mg via INTRAVENOUS
  Filled 2021-11-07: qty 100

## 2021-11-07 MED ORDER — ALUM & MAG HYDROXIDE-SIMETH 200-200-20 MG/5ML PO SUSP: 30.0000 mL | ORAL | Status: DC | PRN

## 2021-11-07 MED ORDER — OXYCODONE HCL 5 MG/5ML PO SOLN: 5.0000 mg | Freq: Once | ORAL | Status: DC | PRN

## 2021-11-07 MED ORDER — ONDANSETRON HCL 4 MG/2ML IJ SOLN
INTRAMUSCULAR | Status: AC
  Filled 2021-11-07: qty 2

## 2021-11-07 MED ORDER — HYDROMORPHONE HCL 1 MG/ML IJ SOLN: 0.2500 mg | INTRAMUSCULAR | Status: DC | PRN

## 2021-11-07 MED ORDER — DEXAMETHASONE SODIUM PHOSPHATE 10 MG/ML IJ SOLN
INTRAMUSCULAR | Status: AC
  Filled 2021-11-07: qty 1

## 2021-11-07 MED ORDER — ORAL CARE MOUTH RINSE: 15.0000 mL | Freq: Once | OROMUCOSAL | Status: AC

## 2021-11-07 MED ORDER — OXYCODONE HCL 5 MG PO TABS: 10.0000 mg | ORAL_TABLET | ORAL | Status: DC | PRN

## 2021-11-07 MED ORDER — METHOCARBAMOL 500 MG IVPB - SIMPLE MED
500.0000 mg | Freq: Four times a day (QID) | INTRAVENOUS | Status: DC | PRN
Start: 2021-11-07 — End: 2021-11-08
  Filled 2021-11-07: qty 50

## 2021-11-07 MED ORDER — CARBIDOPA-LEVODOPA ER 61.25-245 MG PO CPCR
2.0000 | ORAL_CAPSULE | Freq: Four times a day (QID) | ORAL | Status: DC
  Administered 2021-11-07 – 2021-11-08 (×4): 2 via ORAL

## 2021-11-07 MED ORDER — SODIUM CHLORIDE 0.9 % IR SOLN
Status: DC | PRN
  Administered 2021-11-07: 1000 mL

## 2021-11-07 MED ORDER — SODIUM CHLORIDE 0.9 % IV SOLN: INTRAVENOUS | Status: DC

## 2021-11-07 MED ORDER — DIPHENHYDRAMINE HCL 12.5 MG/5ML PO ELIX: 12.5000 mg | ORAL_SOLUTION | ORAL | Status: DC | PRN

## 2021-11-07 MED ORDER — POLYETHYLENE GLYCOL 3350 17 G PO PACK: 17.0000 g | PACK | Freq: Every day | ORAL | Status: DC | PRN

## 2021-11-07 MED ORDER — MIDAZOLAM HCL 2 MG/2ML IJ SOLN
INTRAMUSCULAR | Status: AC
  Filled 2021-11-07: qty 2

## 2021-11-07 MED ORDER — ONDANSETRON HCL 4 MG PO TABS: 4.0000 mg | ORAL_TABLET | Freq: Four times a day (QID) | ORAL | Status: DC | PRN

## 2021-11-07 MED ORDER — DIAZEPAM 2 MG PO TABS
5.0000 mg | ORAL_TABLET | Freq: Two times a day (BID) | ORAL | Status: DC | PRN
  Administered 2021-11-07: 5 mg via ORAL
  Filled 2021-11-07: qty 3

## 2021-11-07 MED ORDER — HYDROMORPHONE HCL 1 MG/ML IJ SOLN
0.5000 mg | INTRAMUSCULAR | Status: DC | PRN
  Administered 2021-11-07: 0.5 mg via INTRAVENOUS
  Filled 2021-11-07: qty 1

## 2021-11-07 MED ORDER — MIDAZOLAM HCL 5 MG/5ML IJ SOLN
INTRAMUSCULAR | Status: DC | PRN
  Administered 2021-11-07: 1 mg via INTRAVENOUS

## 2021-11-07 MED ORDER — FENTANYL CITRATE (PF) 100 MCG/2ML IJ SOLN
INTRAMUSCULAR | Status: AC
  Filled 2021-11-07: qty 2

## 2021-11-07 MED ORDER — BUPIVACAINE IN DEXTROSE 0.75-8.25 % IT SOLN
INTRATHECAL | Status: DC | PRN
  Administered 2021-11-07: 2 mL via INTRATHECAL

## 2021-11-07 MED ORDER — OXYCODONE HCL 5 MG PO TABS
5.0000 mg | ORAL_TABLET | ORAL | Status: DC | PRN
  Administered 2021-11-07: 5 mg via ORAL
  Administered 2021-11-07: 10 mg via ORAL
  Administered 2021-11-08 (×2): 5 mg via ORAL
  Filled 2021-11-07: qty 2
  Filled 2021-11-07 (×4): qty 1

## 2021-11-07 MED ORDER — ONDANSETRON HCL 4 MG/2ML IJ SOLN: 4.0000 mg | Freq: Once | INTRAMUSCULAR | Status: DC | PRN

## 2021-11-07 MED ORDER — CEFAZOLIN SODIUM-DEXTROSE 2-4 GM/100ML-% IV SOLN
2.0000 g | INTRAVENOUS | Status: AC
  Administered 2021-11-07: 2 g via INTRAVENOUS
  Filled 2021-11-07: qty 100

## 2021-11-07 SURGICAL SUPPLY — 41 items
BAG COUNTER SPONGE SURGICOUNT (BAG) ×2 IMPLANT
BAG ZIPLOCK 12X15 (MISCELLANEOUS) IMPLANT
BENZOIN TINCTURE PRP APPL 2/3 (GAUZE/BANDAGES/DRESSINGS) IMPLANT
BLADE SAW SGTL 18X1.27X75 (BLADE) ×2 IMPLANT
COVER PERINEAL POST (MISCELLANEOUS) ×2 IMPLANT
COVER SURGICAL LIGHT HANDLE (MISCELLANEOUS) ×2 IMPLANT
CUP ACET PNNCL SECTR W/GRIP 56 (Hips) IMPLANT
DRAPE FOOT SWITCH (DRAPES) ×2 IMPLANT
DRAPE STERI IOBAN 125X83 (DRAPES) ×2 IMPLANT
DRAPE U-SHAPE 47X51 STRL (DRAPES) ×4 IMPLANT
DRSG AQUACEL AG ADV 3.5X10 (GAUZE/BANDAGES/DRESSINGS) ×2 IMPLANT
DURAPREP 26ML APPLICATOR (WOUND CARE) ×2 IMPLANT
ELECT REM PT RETURN 15FT ADLT (MISCELLANEOUS) ×2 IMPLANT
GAUZE XEROFORM 1X8 LF (GAUZE/BANDAGES/DRESSINGS) ×2 IMPLANT
GLOVE SRG 8 PF TXTR STRL LF DI (GLOVE) ×2 IMPLANT
GLOVE SURG ENC MOIS LTX SZ7.5 (GLOVE) ×2 IMPLANT
GLOVE SURG NEOPR MICRO LF SZ8 (GLOVE) ×2 IMPLANT
GLOVE SURG UNDER POLY LF SZ8 (GLOVE) ×2
GOWN STRL REUS W/TWL XL LVL3 (GOWN DISPOSABLE) ×4 IMPLANT
HANDPIECE INTERPULSE COAX TIP (DISPOSABLE) ×1
HEAD M SROM 36MM PLUS 1.5 (Hips) IMPLANT
HOLDER FOLEY CATH W/STRAP (MISCELLANEOUS) ×2 IMPLANT
KIT TURNOVER KIT A (KITS) IMPLANT
PACK ANTERIOR HIP CUSTOM (KITS) ×2 IMPLANT
PINN SECTOR W/GRIP ACE CUP 56 (Hips) ×2 IMPLANT
PINNACLE ALTRX PLUS 4 N 36X56 (Hips) ×1 IMPLANT
SET HNDPC FAN SPRY TIP SCT (DISPOSABLE) ×1 IMPLANT
SPONGE T-LAP 18X18 ~~LOC~~+RFID (SPONGE) ×6 IMPLANT
SROM M HEAD 36MM PLUS 1.5 (Hips) ×2 IMPLANT
STAPLER VISISTAT 35W (STAPLE) IMPLANT
STEM CORAIL HC SZ14 135 (Stem) ×1 IMPLANT
STRIP CLOSURE SKIN 1/2X4 (GAUZE/BANDAGES/DRESSINGS) IMPLANT
SUT ETHIBOND NAB CT1 #1 30IN (SUTURE) ×2 IMPLANT
SUT ETHILON 2 0 PS N (SUTURE) IMPLANT
SUT MNCRL AB 4-0 PS2 18 (SUTURE) IMPLANT
SUT VIC AB 0 CT1 36 (SUTURE) ×2 IMPLANT
SUT VIC AB 1 CT1 36 (SUTURE) ×2 IMPLANT
SUT VIC AB 2-0 CT1 27 (SUTURE) ×2
SUT VIC AB 2-0 CT1 TAPERPNT 27 (SUTURE) ×2 IMPLANT
TRAY FOLEY MTR SLVR 16FR STAT (SET/KITS/TRAYS/PACK) IMPLANT
YANKAUER SUCT BULB TIP NO VENT (SUCTIONS) ×2 IMPLANT

## 2021-11-07 NOTE — Evaluation (Signed)
Physical Therapy Evaluation Patient Details Name: Sean MunsonRobert D Tyler MRN: [id number] DOB: 09/23/1956 Today's Date: 11/07/2021  History of Present Illness  Pt is 66 yo male s/p L anterior THA on 11/07/21.  Pt with hx including OA, Parkinson's , PVD, and L TKA.  Clinical Impression  Pt is s/p THA resulting in the deficits listed below (see PT Problem List). Pt has hx of Parkinson's but is typically able to ambulate with his neurological rollator or cane as long as he gets Parkinson's meds on time.  He is independent with ADLs.  Today, pt requiring min A for transfers with mod A for cues/sequencing.  He ambulated 7580' with RW and min A - he did have festinating pattern that improved with cues for larger steps.  Pt hopeful to use his rollator at d/c  - encouraged family to bring in for trial with PT.  Pt will benefit from skilled PT to increase their independence and safety with mobility to allow discharge to the venue listed below.         Recommendations for follow up therapy are one component of a multi-disciplinary discharge planning process, led by the attending physician.  Recommendations may be updated based on patient status, additional functional criteria and insurance authorization.  Follow Up Recommendations Follow physician's recommendations for discharge plan and follow up therapies    Assistance Recommended at Discharge Frequent or constant Supervision/Assistance  Patient can return home with the following  A little help with walking and/or transfers;A little help with bathing/dressing/bathroom;Assistance with cooking/housework;Help with stairs or ramp for entrance    Equipment Recommendations None recommended by PT  Recommendations for Other Services       Functional Status Assessment Patient has had a recent decline in their functional status and demonstrates the ability to make significant improvements in function in a reasonable and predictable amount of time.     Precautions /  Restrictions Precautions Precautions: Fall Restrictions Weight Bearing Restrictions: Yes LLE Weight Bearing: Weight bearing as tolerated      Mobility  Bed Mobility Overal bed mobility: Needs Assistance Bed Mobility: Supine to Sit     Supine to sit: Min assist;HOB elevated     General bed mobility comments: Cues for sequencing; assist for both legs and min A to lift trunk    Transfers Overall transfer level: Needs assistance Equipment used: Rolling walker (2 wheels) Transfers: Sit to/from Stand Sit to Stand: Min assist;From elevated surface           General transfer comment: Light min A to stand    Ambulation/Gait Ambulation/Gait assistance: Min assist Gait Distance (Feet): 80 Feet Assistive device: Rolling walker (2 wheels) Gait Pattern/deviations: Step-through pattern;Decreased stride length;Festinating Gait velocity: decreased     General Gait Details: Pt with light min A for balance and to advance or turn RW.  Tends to have festinating pattern at times but could improve with cues for big steps except with turns and doorways  Stairs            Wheelchair Mobility    Modified Rankin (Stroke Patients Only)       Balance Overall balance assessment: Needs assistance Sitting-balance support: No upper extremity supported Sitting balance-Leahy Scale: Good     Standing balance support: Bilateral upper extremity supported;Reliant on assistive device for balance Standing balance-Leahy Scale: Poor Standing balance comment: using RW  Pertinent Vitals/Pain Pain Assessment: 0-10 Pain Score: 4  Pain Location: L hip (no pain rest, 4/10 after movement) Pain Descriptors / Indicators: Aching Pain Intervention(s): Limited activity within patient's tolerance;Monitored during session;Repositioned;Patient requesting pain meds-RN notified    Home Living Family/patient expects to be discharged to:: Private residence Living  Arrangements: Spouse/significant other Available Help at Discharge: Family;Available 24 hours/day Type of Home: House Home Access: Stairs to enter Entrance Stairs-Rails: Left Entrance Stairs-Number of Steps: 3   Home Layout: Two level;Able to live on main level with bedroom/bathroom Home Equipment: Shower seat - built in;Grab bars - tub/shower Additional Comments: Reports shower seat is small; no grab bar by toilet but has counter and cubby to grab in; has a "neurological" rollator - stops when handles releases    Prior Function Prior Level of Function : Independent/Modified Independent             Mobility Comments: Walks with special rollator or cane; can ambulate short community ambulation ; does have difficulty if Parkinson's meds are off schedule ADLs Comments: Reports independent with ADLs     Hand Dominance        Extremity/Trunk Assessment   Upper Extremity Assessment Upper Extremity Assessment: Overall WFL for tasks assessed    Lower Extremity Assessment Lower Extremity Assessment: LLE deficits/detail;RLE deficits/detail RLE Deficits / Details: ROM WFL; MMT 5/5 RLE Sensation: WNL LLE Deficits / Details: ROM: WFL; MMT 5/5 ankle, 3/5 knee, 1/5 hip LLE Sensation: WNL    Cervical / Trunk Assessment Cervical / Trunk Assessment: Normal  Communication   Communication: No difficulties  Cognition Arousal/Alertness: Awake/alert Behavior During Therapy: WFL for tasks assessed/performed Overall Cognitive Status: Within Functional Limits for tasks assessed                                 General Comments: Hx of Parkinson's - soft spoken at times but answering appropriately        General Comments General comments (skin integrity, edema, etc.): Pt familiar with Parkinson's Big and Loud/LSVT program - did well with cues for bigger steps and to speak louder.    Exercises     Assessment/Plan    PT Assessment Patient needs continued PT services  PT  Problem List Decreased strength;Decreased mobility;Decreased safety awareness;Decreased range of motion;Decreased activity tolerance;Decreased balance;Decreased knowledge of use of DME;Pain       PT Treatment Interventions DME instruction;Therapeutic activities;Modalities;Gait training;Therapeutic exercise;Patient/family education;Stair training;Balance training;Functional mobility training    PT Goals (Current goals can be found in the Care Plan section)  Acute Rehab PT Goals Patient Stated Goal: return home PT Goal Formulation: With patient/family Time For Goal Achievement: 11/21/21 Potential to Achieve Goals: Good    Frequency 7X/week     Co-evaluation               AM-PAC PT "6 Clicks" Mobility  Outcome Measure Help needed turning from your back to your side while in a flat bed without using bedrails?: A Little Help needed moving from lying on your back to sitting on the side of a flat bed without using bedrails?: A Little Help needed moving to and from a bed to a chair (including a wheelchair)?: A Little Help needed standing up from a chair using your arms (e.g., wheelchair or bedside chair)?: A Little Help needed to walk in hospital room?: A Little Help needed climbing 3-5 steps with a railing? : A Lot 6 Click Score: 17  End of Session Equipment Utilized During Treatment: Gait belt Activity Tolerance: Patient tolerated treatment well Patient left: with chair alarm set;in chair;with call bell/phone within reach;with family/visitor present Nurse Communication: Mobility status;Patient requests pain meds PT Visit Diagnosis: Other abnormalities of gait and mobility (R26.89);Muscle weakness (generalized) (M62.81)    Time: 5038-8828 PT Time Calculation (min) (ACUTE ONLY): 37 min   Charges:   PT Evaluation $PT Eval Moderate Complexity: 1 Mod PT Treatments $Gait Training: 8-22 mins        Anise Salvo, PT Acute Rehab Services Pager [phone] [facility]  [phone]   Rayetta Humphrey 11/07/2021, 3:39 PM

## 2021-11-07 NOTE — Anesthesia Procedure Notes (Signed)
Spinal  Patient location during procedure: OR Start time: 11/07/2021 9:05 AM End time: 11/07/2021 9:09 AM Reason for block: surgical anesthesia Staffing Performed: anesthesiologist  Anesthesiologist: Mal Amabile, MD Preanesthetic Checklist Completed: patient identified, IV checked, site marked, risks and benefits discussed, surgical consent, monitors and equipment checked, pre-op evaluation and timeout performed Spinal Block Patient position: sitting Prep: DuraPrep and site prepped and draped Patient monitoring: heart rate, cardiac monitor, continuous pulse ox and blood pressure Approach: midline Location: L3-4 Injection technique: single-shot Needle Needle type: Pencan  Needle gauge: 24 G Needle length: 9 cm Needle insertion depth: 7 cm Assessment Sensory level: T4 Events: CSF return Additional Notes Patient tolerated procedure well. Adequate sensory level.

## 2021-11-07 NOTE — Op Note (Signed)
NAME: CASHEL, BELLINA MEDICAL RECORD NO: [id number] ACCOUNT NO: [id number] DATE OF BIRTH: 03-Jun-1956 FACILITY: [facility]ATION: [id number] PHYSICIAN: Vanita Panda. Magnus Ivan, MD  Operative Report   DATE OF PROCEDURE: 11/07/2021  PREOPERATIVE DIAGNOSIS:  Primary osteoarthritis and degenerative joint disease, left hip.  POSTOPERATIVE DIAGNOSIS:  Primary osteoarthritis and degenerative joint disease, left hip.  PROCEDURE:  Left total hip arthroplasty through direct anterior approach.  IMPLANTS:  DePuy Sector Gription acetabular component, size 56, size 36+4 neutral polyethylene liner, size 14 Corail femoral component with high offset, size 36+1.5 metal hip ball.  SURGEON:  Vanita Panda. Magnus Ivan, MD  ASSISTANT: Richardean Canal, PA-C.  ANESTHESIA:  Spinal.  ANTIBIOTICS:  2 g IV Ancef.  ESTIMATED BLOOD LOSS:  1100 mL  COMPLICATIONS:  None.  INDICATIONS:  The patient is a 66 year old gentleman with Parkinson's disease, who has debilitating arthritis involving his left hip.  I have actually replaced his right hip 12 years ago and his left knee about 10 years ago.  At this point, his right hip  pain has become debilitating.  His x-rays do show severe arthritis of the left hip.  We talked about hip replacement surgery again through direct anterior approach.  He understands the biggest risk of acute blood loss anemia, nerve or vessel injury,  fracture, infection, DVT, implant failure, skin and soft tissue issues and leg length differences.  He understands our goals are to decrease pain, improve mobility and overall improve quality of life.  DESCRIPTION OF PROCEDURE:  After informed consent was obtained, appropriate left hip was marked, he was brought to the operating room and sat up on a stretcher where spinal anesthesia was obtained, he was laid in supine position on a stretcher.  A Foley  catheter was placed. I assessed his leg lengths and they were equal.  Traction boots were placed on both his  feet.  Next, he was placed supine on the Hana fracture table, the perineal post in place and both legs in line skeletal traction device and no  traction applied.  I did take preoperative x-rays so I could get a good judgment of his leg lengths and offset.  We then prepped his left hip with DuraPrep and sterile drapes.  A timeout was called and he was identified correct patient, correct left hip.   We then made an incision just inferior and posterior to the anterior superior iliac spine and carried this slightly obliquely down the leg.  I dissected down tensor fascia lata muscle.  Tensor fascia was then divided longitudinally to proceed with  direct anterior approach to the hip.  We identified and cauterized circumflex vessels and identified the hip capsule, opened the hip capsule in an L-type format finding, moderate joint effusion and significant arthritis around the lateral femoral head  and neck.  We placed curved retractors around the medial and lateral femoral neck and made a femoral neck cut with oscillating saw and completed this with an osteotome.  This was just proximal to the lesser trochanter.  We placed a corkscrew guide in the  femoral head and removed the femoral head in its entirety.  I then removed remnants of acetabular labrum and other debris and placed a bent Hohmann over the medial acetabular rim.  We then began reaming under direct visualization from a size 43 reamer  in a stepwise increments going up to size 55 reamer with all reamers placed under direct visualization.  Last reamer placed under direct fluoroscopy to obtain a depth of reaming, our inclination  and anteversion.  I then placed real DePuy Sector Gription  acetabular component size 56.  Of note, he had brisk bleeding all throughout the bone of his acetabulum and his femoral canal.  There was no identified bleeding vessels, but definitely a lot of bleeding from his bone.  We did put the size 56 acetabular  component in under  fluoroscopy and I was pleased with the positioning of that, so we went with a 36+4 polyethylene liner.  Attention was then turned to the femur.  With the leg externally rotated to 120 degrees and extended and adducted, we were able to  place a Mueller retractor medially and Hohman retractor behind the greater trochanter. We released lateral joint capsule and used a box cutting osteotome to enter the femoral canal and a rongeur to lateralize. We then began broaching using the Corail  broaching system from a size 8 going all the way up to a size 14. With a size 14 in place, we trialed a standard offset femoral neck and a 36+1.5 hip ball and reduced this in the acetabulum.  We assessed it under radiographic and clinical assessment. It  was stable, but he definitely needed more offset, but leg lengths were fine.  I dislocated the hip, removed the trial components.  We then placed the real size 14 femoral component, but one with high offset femoral neck and a 36+1.5 metal hip ball and  again reduced this in the acetabulum. We were pleased with leg length, offset, range of motion and stability assessed radiographically and clinically.  We then irrigated the hip with normal saline solution using pulsatile lavage.  We dried it real well  and all the oozing had stopped, but we still put topical tranexamic acid all around the hip joint.  We then closed the arthrotomy with interrupted #1 Vicryl suture followed by a #1 Vicryl to close the tensor fascia.  0 Vicryl was used to close the deep  tissue and 2-0 Vicryl was used to close subcutaneous tissue.  The skin was closed with staples.  An Aquacel dressing was applied.  He was taken off the table and taken to recovery room in stable condition with all final counts being correct.  There were  no complications noted.    Of note, Rexene Edison, PA-C, did assist during the entire case and assistance was crucial for facilitating every aspect of this case.     PAA D:  11/07/2021 10:28:38 am T: 11/07/2021 11:22:00 pm  JOB: [id number]/ [id number]

## 2021-11-07 NOTE — Transfer of Care (Signed)
Immediate Anesthesia Transfer of Care Note  Patient: Sean Tyler  Procedure(s) Performed: TOTAL HIP ARTHROPLASTY ANTERIOR APPROACH (Left: Hip)  Patient Location: PACU  Anesthesia Type:Spinal  Level of Consciousness: sedated and patient cooperative  Airway & Oxygen Therapy: Patient Spontanous Breathing and Patient connected to face mask oxygen  Post-op Assessment: Report given to RN and Post -op Vital signs reviewed and stable  Post vital signs: stable  Last Vitals:  Vitals Value Taken Time  BP 94/57 11/07/21 1100  Temp    Pulse 73 11/07/21 1103  Resp 13 11/07/21 1103  SpO2 100 % 11/07/21 1103  Vitals shown include unvalidated device data.  Last Pain:  Vitals:   11/07/21 0651  TempSrc:   PainSc: 0-No pain      Patients Stated Pain Goal: 4 (11/07/21 5809)  Complications: No notable events documented.

## 2021-11-07 NOTE — Anesthesia Postprocedure Evaluation (Signed)
Anesthesia Post Note  Patient: Sean Tyler  Procedure(s) Performed: TOTAL HIP ARTHROPLASTY ANTERIOR APPROACH (Left: Hip)     Patient location during evaluation: PACU Anesthesia Type: Spinal Level of consciousness: oriented and awake and alert Pain management: pain level controlled Vital Signs Assessment: post-procedure vital signs reviewed and stable Respiratory status: spontaneous breathing, respiratory function stable and nonlabored ventilation Cardiovascular status: blood pressure returned to baseline and stable Postop Assessment: no headache, no backache, no apparent nausea or vomiting, spinal receding and patient able to bend at knees Anesthetic complications: no   No notable events documented.  Last Vitals:  Vitals:   11/07/21 1130 11/07/21 1134  BP: (!) 89/71 106/64  Pulse: 78 74  Resp: 13 15  Temp:    SpO2: 97% 95%    Last Pain:  Vitals:   11/07/21 1115  TempSrc:   PainSc: 0-No pain                 Haruna Rohlfs A.

## 2021-11-07 NOTE — Anesthesia Procedure Notes (Signed)
Procedure Name: MAC Date/Time: 11/07/2021 9:02 AM Performed by: Lissa Morales, CRNA Pre-anesthesia Checklist: Patient identified, Suction available, Patient being monitored, Emergency Drugs available and Timeout performed Patient Re-evaluated:Patient Re-evaluated prior to induction Oxygen Delivery Method: Simple face mask Placement Confirmation: positive ETCO2

## 2021-11-07 NOTE — Interval H&P Note (Signed)
History and Physical Interval Note: The patient understands fully that he is here today for a left total hip replacement to treat his left hip osteoarthritis.  There has been no acute or interval change in his medical status.  Please see recent H&P.  The risks and benefits of surgery have been explained in detail and informed consent is obtained.  The left hip has been marked.  11/07/2021 7:07 AM  Sean Tyler  has presented today for surgery, with the diagnosis of Osteoarthritis / Degenerative joint Diease Left Hip.  The various methods of treatment have been discussed with the patient and family. After consideration of risks, benefits and other options for treatment, the patient has consented to  Procedure(s): TOTAL HIP ARTHROPLASTY ANTERIOR APPROACH (Left) as a surgical intervention.  The patient's history has been reviewed, patient examined, no change in status, stable for surgery.  I have reviewed the patient's chart and labs.  Questions were answered to the patient's satisfaction.     Kathryne Hitch

## 2021-11-07 NOTE — Plan of Care (Signed)

## 2021-11-07 NOTE — Brief Op Note (Signed)
11/07/2021  10:30 AM  PATIENT:  Sean Tyler  66 y.o. male  PRE-OPERATIVE DIAGNOSIS:  Osteoarthritis / Degenerative joint Diease Left Hip  POST-OPERATIVE DIAGNOSIS:  Osteoarthritis / Degenerative joint Diease Left Hip  PROCEDURE:  Procedure(s): TOTAL HIP ARTHROPLASTY ANTERIOR APPROACH (Left)  SURGEON:  Surgeon(s) and Role:    Mcarthur Rossetti, MD - Primary  PHYSICIAN ASSISTANT:  Benita Stabile, PA-C  ANESTHESIA:   spinal  EBL:  1100 mL   COUNTS:  YES  DICTATION: .Other Dictation: Dictation Number [id number]  PLAN OF CARE: Admit for overnight observation  PATIENT DISPOSITION:  PACU - hemodynamically stable.   Delay start of Pharmacological VTE agent (>24hrs) due to surgical blood loss or risk of bleeding: no

## 2021-11-08 DIAGNOSIS — M1612 Unilateral primary osteoarthritis, left hip: Secondary | ICD-10-CM | POA: Diagnosis not present

## 2021-11-08 LAB — CBC
HCT: 35.5 % — ABNORMAL LOW (ref 39.0–52.0)
Hemoglobin: 11.7 g/dL — ABNORMAL LOW (ref 13.0–17.0)
MCH: 31.2 pg (ref 26.0–34.0)
MCHC: 33 g/dL (ref 30.0–36.0)
MCV: 94.7 fL (ref 80.0–100.0)
Platelets: 218 10*3/uL (ref 150–400)
RBC: 3.75 MIL/uL — ABNORMAL LOW (ref 4.22–5.81)
RDW: 12.1 % (ref 11.5–15.5)
WBC: 15.2 10*3/uL — ABNORMAL HIGH (ref 4.0–10.5)
nRBC: 0 % (ref 0.0–0.2)

## 2021-11-08 LAB — BASIC METABOLIC PANEL
Anion gap: 8 (ref 5–15)
BUN: 16 mg/dL (ref 8–23)
CO2: 23 mmol/L (ref 22–32)
Calcium: 8.5 mg/dL — ABNORMAL LOW (ref 8.9–10.3)
Chloride: 105 mmol/L (ref 98–111)
Creatinine, Ser: 0.69 mg/dL (ref 0.61–1.24)
GFR, Estimated: >60 mL/min (ref >60)
Glucose, Bld: 148 mg/dL — ABNORMAL HIGH (ref 70–99)
Potassium: 4.1 mmol/L (ref 3.5–5.1)
Sodium: 136 mmol/L (ref 135–145)

## 2021-11-08 MED ORDER — ASPIRIN 81 MG PO CHEW: 81.0000 mg | CHEWABLE_TABLET | Freq: Two times a day (BID) | ORAL | 0 refills | Status: DC

## 2021-11-08 MED ORDER — OXYCODONE HCL 5 MG PO TABS: 5.0000 mg | ORAL_TABLET | ORAL | 0 refills | Status: DC | PRN

## 2021-11-08 NOTE — Progress Notes (Signed)
Provided discharge education/instructions, all questions and concerns addressed, Pt not in acute distress, discharged home with belongings accompanied by wife. °

## 2021-11-08 NOTE — TOC Transition Note (Signed)
Transition of Care [facility]) - CM/SW Discharge Note   Patient Details  Name: Sean Tyler MRN: [id number] Date of Birth: 14-Nov-1955  [facility] of [facility] CM/SW Contact:  Ross Ludwig, LCSW Phone Number: 11/08/2021, 11:56 AM   Clinical Narrative:    CSW spoke to patient's wife to find out if they needed any equipment, per wife, they already have the equipment.  Patient will be going home with home health through [facility].  CSW signing off please reconsult with any other social work needs, home health agency has been notified of planned discharge.   Final next level of care: [place] Barriers to Discharge: Barriers Resolved   Patient Goals and CMS Choice Patient states their goals for this hospitalization and ongoing recovery are:: To return back home with home health PT. CMS Medicare.gov Compare Post Acute Care list provided to:: Patient Represenative (must comment) Choice offered to / list presented to : Spouse  Discharge Placement                       Discharge Plan and Services                          HH Arranged: PT [facility] Agency: [place] Date [place]: 11/03/21 Time [place]: [id number] Representative spoke with at [place]: [place] Determinants of Health ([place]) Interventions     Readmission Risk Interventions No flowsheet data found.

## 2021-11-08 NOTE — Plan of Care (Signed)

## 2021-11-08 NOTE — Progress Notes (Signed)
Physical Therapy Treatment Patient Details Name: Sean Tyler MRN: [id number] DOB: April 04, 1956 Today's Date: 11/08/2021   History of Present Illness Pt is 66 yo male s/p L anterior THA on 11/07/21.  Pt with hx including OA, Parkinson's , PVD, and L TKA.    PT Comments    Patient making steady progress with mobility, limited today by impaired motor planning due to timing of PD meds. Pt able to ambulate short distance and complete seated exercises. Will follow up for additional gait and stair training for safe discharge home.    Recommendations for follow up therapy are one component of a multi-disciplinary discharge planning process, led by the attending physician.  Recommendations may be updated based on patient status, additional functional criteria and insurance authorization.  Follow Up Recommendations  Follow physician's recommendations for discharge plan and follow up therapies     Assistance Recommended at Discharge Frequent or constant Supervision/Assistance  Patient can return home with the following A little help with walking and/or transfers;A little help with bathing/dressing/bathroom;Assistance with cooking/housework;Help with stairs or ramp for entrance   Equipment Recommendations  None recommended by PT    Recommendations for Other Services       Precautions / Restrictions Precautions Precautions: Fall Restrictions Weight Bearing Restrictions: Yes LLE Weight Bearing: Weight bearing as tolerated     Mobility  Bed Mobility Overal bed mobility: Needs Assistance Bed Mobility: Supine to Sit     Supine to sit: Min assist;HOB elevated     General bed mobility comments: Cues for sequencing; and use of belt for Lt LE to move to EOB. pt completed supine<>sit 2x as MD arrived to change dressing.    Transfers Overall transfer level: Needs assistance Equipment used: Rolling walker (2 wheels) Transfers: Sit to/from Stand Sit to Stand: Min assist;From elevated  surface           General transfer comment: Light min A to stand from slightly elevated EOB.    Ambulation/Gait Ambulation/Gait assistance: Min assist Gait Distance (Feet): 15 Feet Assistive device: Rolling walker (2 wheels) Gait Pattern/deviations: Decreased stride length;Festinating;Step-to pattern;Decreased step length - right;Decreased step length - left;Shuffle;Narrow base of support Gait velocity: decreased     General Gait Details: light assist to steady, Parkinsons med's missed by abuot 1 hour and pt with noted incresed festinating gait and short steps. pt c/o lightheadedness by door and gait distance limited.   Stairs             Wheelchair Mobility    Modified Rankin (Stroke Patients Only)       Balance Overall balance assessment: Needs assistance Sitting-balance support: No upper extremity supported Sitting balance-Leahy Scale: Good     Standing balance support: Bilateral upper extremity supported;Reliant on assistive device for balance Standing balance-Leahy Scale: Poor Standing balance comment: using RW                            Cognition Arousal/Alertness: Awake/alert Behavior During Therapy: WFL for tasks assessed/performed Overall Cognitive Status: Within Functional Limits for tasks assessed                                          Exercises Total Joint Exercises Ankle Circles/Pumps: AROM;Both;15 reps;Seated Quad Sets: AROM;Left;10 reps;Seated Heel Slides: AROM;Left;10 reps;Seated    General Comments        Pertinent Vitals/Pain Pain  Assessment: 0-10 Pain Score: 7  Pain Location: Lt knee Pain Descriptors / Indicators: Aching Pain Intervention(s): Limited activity within patient's tolerance    Home Living                          Prior Function            PT Goals (current goals can now be found in the care plan section) Acute Rehab PT Goals Patient Stated Goal: return home PT Goal  Formulation: With patient/family Time For Goal Achievement: 11/21/21 Potential to Achieve Goals: Good Progress towards PT goals: Progressing toward goals    Frequency    7X/week      PT Plan Current plan remains appropriate    Co-evaluation              AM-PAC PT "6 Clicks" Mobility   Outcome Measure  Help needed turning from your back to your side while in a flat bed without using bedrails?: A Little Help needed moving from lying on your back to sitting on the side of a flat bed without using bedrails?: A Little Help needed moving to and from a bed to a chair (including a wheelchair)?: A Little Help needed standing up from a chair using your arms (e.g., wheelchair or bedside chair)?: A Little Help needed to walk in hospital room?: A Little Help needed climbing 3-5 steps with a railing? : A Lot 6 Click Score: 17    End of Session Equipment Utilized During Treatment: Gait belt Activity Tolerance: Patient tolerated treatment well Patient left: with chair alarm set;in chair;with call bell/phone within reach;with family/visitor present Nurse Communication: Mobility status PT Visit Diagnosis: Other abnormalities of gait and mobility (R26.89);Muscle weakness (generalized) (M62.81)     Time: 4081-4481 PT Time Calculation (min) (ACUTE ONLY): 36 min  Charges:  $Gait Training: 8-22 mins $Therapeutic Exercise: 8-22 mins                     Wynn Maudlin, DPT Acute Rehabilitation Services Office [phone] Pager [phone]    Anitra Lauth 11/08/2021, 10:49 AM

## 2021-11-08 NOTE — Progress Notes (Signed)
Subjective: 1 Day Post-Op Procedure(s) (LRB): TOTAL HIP ARTHROPLASTY ANTERIOR APPROACH (Left) Patient reports pain as moderate.  Acute blood loss anemia from surgery but tolerating well.  Objective: Vital signs in last 24 hours: Temp:  [97.6 F (36.4 C)-98.6 F (37 C)] 97.8 F (36.6 C) (01/14 0552) Pulse Rate:  [65-106] 73 (01/14 0552) Resp:  [10-20] 16 (01/14 0552) BP: (85-136)/(57-77) 111/65 (01/14 0552) SpO2:  [95 %-100 %] 95 % (01/14 0552)  Intake/Output from previous day: 01/13 0701 - 01/14 0700 In: 6103.7 [P.O.:960; I.V.:4493.7; IV Piggyback:650] Out: 4575 [Urine:3475; Blood:1100] Intake/Output this shift: No intake/output data recorded.  Recent Labs    11/08/21 0325  HGB 11.7*   Recent Labs    11/08/21 0325  WBC 15.2*  RBC 3.75*  HCT 35.5*  PLT 218   Recent Labs    11/08/21 0325  NA 136  K 4.1  CL 105  CO2 23  BUN 16  CREATININE 0.69  GLUCOSE 148*  CALCIUM 8.5*   No results for input(s): LABPT, INR in the last 72 hours.  Sensation intact distally Intact pulses distally Dorsiflexion/Plantar flexion intact Incision: scant drainage   Assessment/Plan: 1 Day Post-Op Procedure(s) (LRB): TOTAL HIP ARTHROPLASTY ANTERIOR APPROACH (Left) Up with therapy Discharge home with home health this afternoon.      Mcarthur Rossetti 11/08/2021, 9:10 AM

## 2021-11-08 NOTE — Plan of Care (Signed)
  Problem: Pain Management: Goal: Pain level will decrease with appropriate interventions Outcome: Progressing   

## 2021-11-08 NOTE — Progress Notes (Signed)
Physical Therapy Treatment Patient Details Name: Sean Tyler MRN: [id number] DOB: June 04, 1956 Today's Date: 11/08/2021   History of Present Illness Pt is 66 yo male s/p L anterior THA on 11/07/21.  Pt with hx including OA, Parkinson's , PVD, and L TKA.    PT Comments    Patient seen for additional session and ambulated increased distance of ~100' with guarding from spouse and no LOB noted. Pt completed stairs with no LOB and safe sequencing. Educated on remaining exercises for HEP. He is at safe level for discharge home with assist from wife. Will continue to progress pt as able.    Recommendations for follow up therapy are one component of a multi-disciplinary discharge planning process, led by the attending physician.  Recommendations may be updated based on patient status, additional functional criteria and insurance authorization.  Follow Up Recommendations  Follow physician's recommendations for discharge plan and follow up therapies     Assistance Recommended at Discharge Frequent or constant Supervision/Assistance  Patient can return home with the following A little help with walking and/or transfers;A little help with bathing/dressing/bathroom;Assistance with cooking/housework;Help with stairs or ramp for entrance   Equipment Recommendations  None recommended by PT    Recommendations for Other Services       Precautions / Restrictions Precautions Precautions: Fall Restrictions Weight Bearing Restrictions: No LLE Weight Bearing: Weight bearing as tolerated     Mobility  Bed Mobility Overal bed mobility: Needs Assistance Bed Mobility: Supine to Sit     Supine to sit: HOB elevated;Min guard     General bed mobility comments: cues to use belt for Lt LE off EOB, no assist needed.    Transfers Overall transfer level: Needs assistance Equipment used: Rolling walker (2 wheels) Transfers: Sit to/from Stand Sit to Stand: From elevated surface;Min guard;Min assist            General transfer comment: cues for technique/hand placement for power up with bil UE, light assist to initaite.    Ambulation/Gait Ambulation/Gait assistance: Min guard Gait Distance (Feet): 100 Feet Assistive device: Rolling walker (2 wheels) Gait Pattern/deviations: Decreased stride length;Festinating;Step-to pattern;Decreased step length - right;Decreased step length - left;Shuffle;Narrow base of support Gait velocity: decreased     General Gait Details: pt's wife provided guarding with supervision and cues form PT. No overt LOB noted, pt's steps much longer with no festinating after first few steps. pt steady with turn.   Stairs Stairs: Yes Stairs assistance: Min guard Stair Management: Two rails;Alternating pattern;Step to pattern;Forwards Number of Stairs: 5 General stair comments: cues for technique/sequence, up wtih good, down with bad. cues for pt's wife to provide safe guarding/assist. no LOB noted throughout..   Wheelchair Mobility    Modified Rankin (Stroke Patients Only)       Balance Overall balance assessment: Needs assistance Sitting-balance support: No upper extremity supported Sitting balance-Leahy Scale: Good     Standing balance support: Bilateral upper extremity supported;Reliant on assistive device for balance Standing balance-Leahy Scale: Poor Standing balance comment: using RW                            Cognition Arousal/Alertness: Awake/alert Behavior During Therapy: WFL for tasks assessed/performed Overall Cognitive Status: Within Functional Limits for tasks assessed  Exercises Total Joint Exercises Ankle Circles/Pumps: AROM;Both;15 reps;Seated Quad Sets: AROM;Left;10 reps;Seated Heel Slides: AROM;Left;10 reps;Seated Hip ABduction/ADduction: AROM;Left;10 reps;Seated    General Comments        Pertinent Vitals/Pain Pain Assessment: 0-10 Pain Score: 4   Pain Location: Lt hip/knee Pain Descriptors / Indicators: Aching Pain Intervention(s): Monitored during session;Limited activity within patient's tolerance;Repositioned    Home Living                          Prior Function            PT Goals (current goals can now be found in the care plan section) Acute Rehab PT Goals Patient Stated Goal: return home PT Goal Formulation: With patient/family Time For Goal Achievement: 11/21/21 Potential to Achieve Goals: Good Progress towards PT goals: Progressing toward goals    Frequency    7X/week      PT Plan Current plan remains appropriate    Co-evaluation              AM-PAC PT "6 Clicks" Mobility   Outcome Measure  Help needed turning from your back to your side while in a flat bed without using bedrails?: A Little Help needed moving from lying on your back to sitting on the side of a flat bed without using bedrails?: A Little Help needed moving to and from a bed to a chair (including a wheelchair)?: A Little Help needed standing up from a chair using your arms (e.g., wheelchair or bedside chair)?: A Little Help needed to walk in hospital room?: A Little Help needed climbing 3-5 steps with a railing? : A Little 6 Click Score: 18    End of Session Equipment Utilized During Treatment: Gait belt Activity Tolerance: Patient tolerated treatment well Patient left: with chair alarm set;in chair;with call bell/phone within reach;with family/visitor present Nurse Communication: Mobility status PT Visit Diagnosis: Other abnormalities of gait and mobility (R26.89);Muscle weakness (generalized) (M62.81)     Time: 1694-5038 PT Time Calculation (min) (ACUTE ONLY): 25 min  Charges:  $Gait Training: 8-22 mins $Therapeutic Exercise: 8-22 mins                     Wynn Maudlin, DPT Acute Rehabilitation Services Office [phone] Pager [phone]    Anitra Lauth 11/08/2021, 1:12 PM

## 2021-11-08 NOTE — Discharge Instructions (Signed)

## 2021-11-08 NOTE — Discharge Summary (Signed)
Patient ID: Sean Tyler MRN: [id number] DOB/AGE: 66/05/1956 66 y.o.  Admit date: 11/07/2021 Discharge date: 11/08/2021  Admission Diagnoses:  Principal Problem:   Unilateral primary osteoarthritis, left hip Active Problems:   Status post left hip replacement   Discharge Diagnoses:  Same  Past Medical History:  Diagnosis Date   Anxiety    Arthritis    Deep vein blood clot of right lower extremity (HCC) 10/27/2007   Depression    Enlarged prostate    trouble with urine stream   GERD (gastroesophageal reflux disease)    occasional    Insomnia    Parkinson's disease (HCC) 11/26/2009   dr haq at [facility]   Peripheral vascular disease (HCC)    Sleep apnea    does not know cpap settings    Surgeries: Procedure(s): TOTAL HIP ARTHROPLASTY ANTERIOR APPROACH on 11/07/2021   Consultants:   Discharged Condition: Improved  Hospital Course: Sean Tyler is an 66 y.o. male who was admitted 11/07/2021 for operative treatment ofUnilateral primary osteoarthritis, left hip. Patient has severe unremitting pain that affects sleep, daily activities, and work/hobbies. After pre-op clearance the patient was taken to the operating room on 11/07/2021 and underwent  Procedure(s): TOTAL HIP ARTHROPLASTY ANTERIOR APPROACH.    Patient was given perioperative antibiotics:  Anti-infectives (From admission, onward)    Start     Dose/Rate Route Frequency Ordered Stop   11/07/21 1600  ceFAZolin (ANCEF) IVPB 1 g/50 mL premix        1 g 100 mL/hr over 30 Minutes Intravenous Every 6 hours 11/07/21 1230 11/07/21 2201   11/07/21 0615  ceFAZolin (ANCEF) IVPB 2g/100 mL premix        2 g 200 mL/hr over 30 Minutes Intravenous On call to O.R. 11/07/21 0604 11/07/21 0904        Patient was given sequential compression devices, early ambulation, and chemoprophylaxis to prevent DVT.  Patient benefited maximally from hospital stay and there were no complications.    Recent vital signs: Patient  Vitals for the past 24 hrs:  BP Temp Temp src Pulse Resp SpO2  11/08/21 0552 111/65 97.8 F (36.6 C) Oral 73 16 95 %  11/08/21 0118 103/62 98.2 F (36.8 C) Oral 85 16 96 %  11/07/21 2122 135/75 98 F (36.7 C) Oral 99 16 95 %  11/07/21 1810 136/76 98.6 F (37 C) Oral (!) 106 17 95 %  11/07/21 1638 133/77 97.9 F (36.6 C) Oral 99 20 97 %  11/07/21 1431 132/77 98.3 F (36.8 C) Oral 80 20 100 %  11/07/21 1234 112/74 97.6 F (36.4 C) Axillary 70 16 100 %  11/07/21 1215 107/69 97.6 F (36.4 C) -- 65 10 95 %  11/07/21 1200 98/72 -- -- 71 11 96 %  11/07/21 1134 106/64 -- -- 74 15 95 %  11/07/21 1130 (!) 89/71 -- -- 78 13 97 %  11/07/21 1115 (!) 85/59 -- -- 73 14 100 %  11/07/21 1100 (!) 94/57 98.2 F (36.8 C) -- 70 15 100 %     Recent laboratory studies:  Recent Labs    11/08/21 0325  WBC 15.2*  HGB 11.7*  HCT 35.5*  PLT 218  NA 136  K 4.1  CL 105  CO2 23  BUN 16  CREATININE 0.69  GLUCOSE 148*  CALCIUM 8.5*     Discharge Medications:   Allergies as of 11/08/2021       Reactions   Bacitracin Itching   Bacitracin-neomycin-polymyxin Itching  Erythromycin Itching   Vancomycin Itching, Rash   Hydrocodone-acetaminophen Nausea And Vomiting   Headache   Sulfa Antibiotics Rash   Only some, not all   Codeine Nausea And Vomiting        Medication List     STOP taking these medications    HYDROcodone-acetaminophen 5-325 MG tablet Commonly known as: NORCO/VICODIN   oxyCODONE-acetaminophen 5-325 MG tablet Commonly known as: PERCOCET/ROXICET       TAKE these medications    ALKA SELTZER PLUS PO Take 1 tablet by mouth daily.   APPLE CIDER VINEGAR PO Take 3,600 mg by mouth 3 (three) times daily. 1200 mg   aspirin 81 MG chewable tablet Chew 1 tablet (81 mg total) by mouth 2 (two) times daily.   calcium carbonate 420 MG Chew chewable tablet Commonly known as: TITRALAC Chew 420 mg by mouth daily as needed for indigestion or heartburn.   carbidopa-levodopa  25-100 MG tablet Commonly known as: SINEMET IR Take 1-2 tablets by mouth daily as needed (to fill in the gaps).   Rytary 61.25-245 MG Cpcr Generic drug: Carbidopa-Levodopa ER Take 2 tablets by mouth 4 (four) times daily.   Rytary 36.25-145 MG Cpcr Generic drug: Carbidopa-Levodopa ER Take 1 tablet by mouth 4 (four) times daily.   diazepam 5 MG tablet Commonly known as: VALIUM Take 5 mg by mouth every 12 (twelve) hours as needed for anxiety.   fluticasone 50 MCG/ACT nasal spray Commonly known as: FLONASE Place 1 spray into both nostrils at bedtime.   oxyCODONE 5 MG immediate release tablet Commonly known as: Roxicodone Take 1-2 tablets (5-10 mg total) by mouth every 4 (four) hours as needed for severe pain.   oxymetazoline 0.05 % nasal spray Commonly known as: AFRIN Place 2 sprays into the nose 2 (two) times daily as needed.   PARoxetine 30 MG tablet Commonly known as: PAXIL Take 30 mg by mouth daily.   PROBIOTIC-PREBIOTIC PO Take 1 tablet by mouth daily.   PROTEIN PO Take 6 oz by mouth daily. Booster 30 G protein 1 G sugar   valACYclovir 1000 MG tablet Commonly known as: VALTREX Take 500 mg by mouth daily as needed (flare-up).   VITAMIN D3 PO Take 1 tablet by mouth daily.               Durable Medical Equipment  (From admission, onward)           Start     Ordered   11/07/21 1231  DME 3 n 1  Once        11/07/21 1230   11/07/21 1231  DME Walker rolling  Once       Question Answer Comment  Walker: With 5 Inch Wheels   Patient needs a walker to treat with the following condition Status post left hip replacement      11/07/21 1230            Diagnostic Studies: DG Pelvis Portable  Result Date: 11/07/2021 CLINICAL DATA:  Status post left hip arthroplasty EXAM: PORTABLE PELVIS 1-2 VIEWS COMPARISON:  10/30/2021 FINDINGS: AP portable view of pelvis shows interval left hip arthroplasty. There is previous right hip arthroplasty. No fracture is  seen. IMPRESSION: Interval left hip arthroplasty. Electronically Signed   By: Ernie Avena M.D.   On: 11/07/2021 12:37   DG C-Arm 1-60 Min-No Report  Result Date: 11/07/2021 Fluoroscopy was utilized by the requesting physician.  No radiographic interpretation.   DG HIP OPERATIVE UNILAT W OR W/O PELVIS LEFT  Result Date: 11/07/2021 CLINICAL DATA:  Left hip replacement. EXAM: OPERATIVE LEFT HIP (WITH PELVIS IF PERFORMED) TECHNIQUE: Fluoroscopic spot image(s) were submitted for interpretation post-operatively. FLUOROSCOPY TIME:  21 seconds.  Cumulative air kerma of 3.15 mGy C-arm fluoroscopic images were obtained intraoperatively and submitted for post operative interpretation. COMPARISON:  Left hip x-rays dated October 30, 2021. FINDINGS: Multiple intraoperative fluoroscopic images demonstrate interval left total hip arthroplasty. Components are well aligned. No acute osseous abnormality. Prior right total hip arthroplasty. IMPRESSION: Intraoperative fluoroscopic guidance for left total hip arthroplasty. Electronically Signed   By: Obie Dredge M.D.   On: 11/07/2021 10:31   XR HIP UNILAT W OR W/O PELVIS 2-3 VIEWS LEFT  Result Date: 10/30/2021 AP pelvis lateral view left hip: No acute fractures.  Both hips well located.  Moderate to moderately near degenerative changes left hip.  Status post right total hip arthroplasty with well-seated components.  XR Knee 1-2 Views Left  Result Date: 10/30/2021 Left knee 2 views: No acute fracture.  Knee is well located.  Status post left total knee arthroplasty with well-seated components.   Disposition: Discharge disposition: 01-Home or Self Care       Discharge Instructions     Discharge patient   Complete by: As directed    Can discharge to home this afternoon if doing well and clears therapy.   Discharge disposition: 01-Home or Self Care   Discharge patient date: 11/08/2021        Follow-up Information     Kathryne Hitch, MD  Follow up in 2 week(s).   Specialty: Orthopedic Surgery Contact information: [address] [place] [address] [phone]                  Signed: Kathryne Hitch 11/08/2021, 9:14 AM

## 2021-11-12 ENCOUNTER — Encounter (HOSPITAL_COMMUNITY): Payer: Self-pay | Admitting: Orthopaedic Surgery

## 2021-11-20 ENCOUNTER — Encounter: Payer: Self-pay | Admitting: Orthopaedic Surgery

## 2021-11-20 ENCOUNTER — Ambulatory Visit (INDEPENDENT_AMBULATORY_CARE_PROVIDER_SITE_OTHER): Payer: Medicare HMO | Admitting: Orthopaedic Surgery

## 2021-11-20 ENCOUNTER — Other Ambulatory Visit: Payer: Self-pay

## 2021-11-20 DIAGNOSIS — Z96642 Presence of left artificial hip joint: Secondary | ICD-10-CM

## 2021-11-20 MED ORDER — OXYCODONE HCL 5 MG PO TABS: 5.0000 mg | ORAL_TABLET | Freq: Four times a day (QID) | ORAL | 0 refills | Status: DC | PRN

## 2021-11-20 NOTE — Progress Notes (Signed)
The patient is a 66 year old gentleman with Parkinson's disease who is 2 weeks tomorrow out from a left total hip arthroplasty.  We have remotely replaced his right hip and his left knee.  He says his knee feels like he has a lot of movement and it did let him know that we did stress the knee on the left side to replace his hip.  He says the hip is doing well.  His left hip incision looks good.  Remove the staples and place Steri-Strips.  His left total knee is ligamentously stable my exam.  His left operative hip moves well.  I told him to do work on strengthening his hip and his knee and he should do well with time.  I would like to see him back in 4 weeks to see how he is doing overall.  If he still has any knee issues at all with the left knee we can always x-ray left knee.  I did refill his oxycodone.  All questions and concerns were answered and addressed.

## 2021-11-26 ENCOUNTER — Telehealth: Payer: Self-pay

## 2021-11-26 NOTE — Telephone Encounter (Signed)
Gerald with [facility] called stating that patient Argyle fell last night while reaching for his Ustep he landed on his left knee. He has no injury's but Is a little sore

## 2022-02-09 ENCOUNTER — Telehealth: Payer: Self-pay | Admitting: Orthopaedic Surgery

## 2022-02-09 NOTE — Telephone Encounter (Signed)
Pt called requesting a prescription for amoxicillin. Pt has an dental appt 4/27. Please send to pharmacy on file. Phone number is [phone]. ?

## 2022-02-09 NOTE — Telephone Encounter (Signed)
Called and advised pt that he is past the recommended three months and doesn't need an antibiotic. He stated understanding  ?

## 2022-09-28 IMAGING — DX DG PORTABLE PELVIS
1 series · 1 of 1 positions shown · non-contrast
Comparison: 10/30/2021

CLINICAL DATA: Status post left hip arthroplasty

EXAM:
PORTABLE PELVIS 1-2 VIEWS

[pelvis ap]
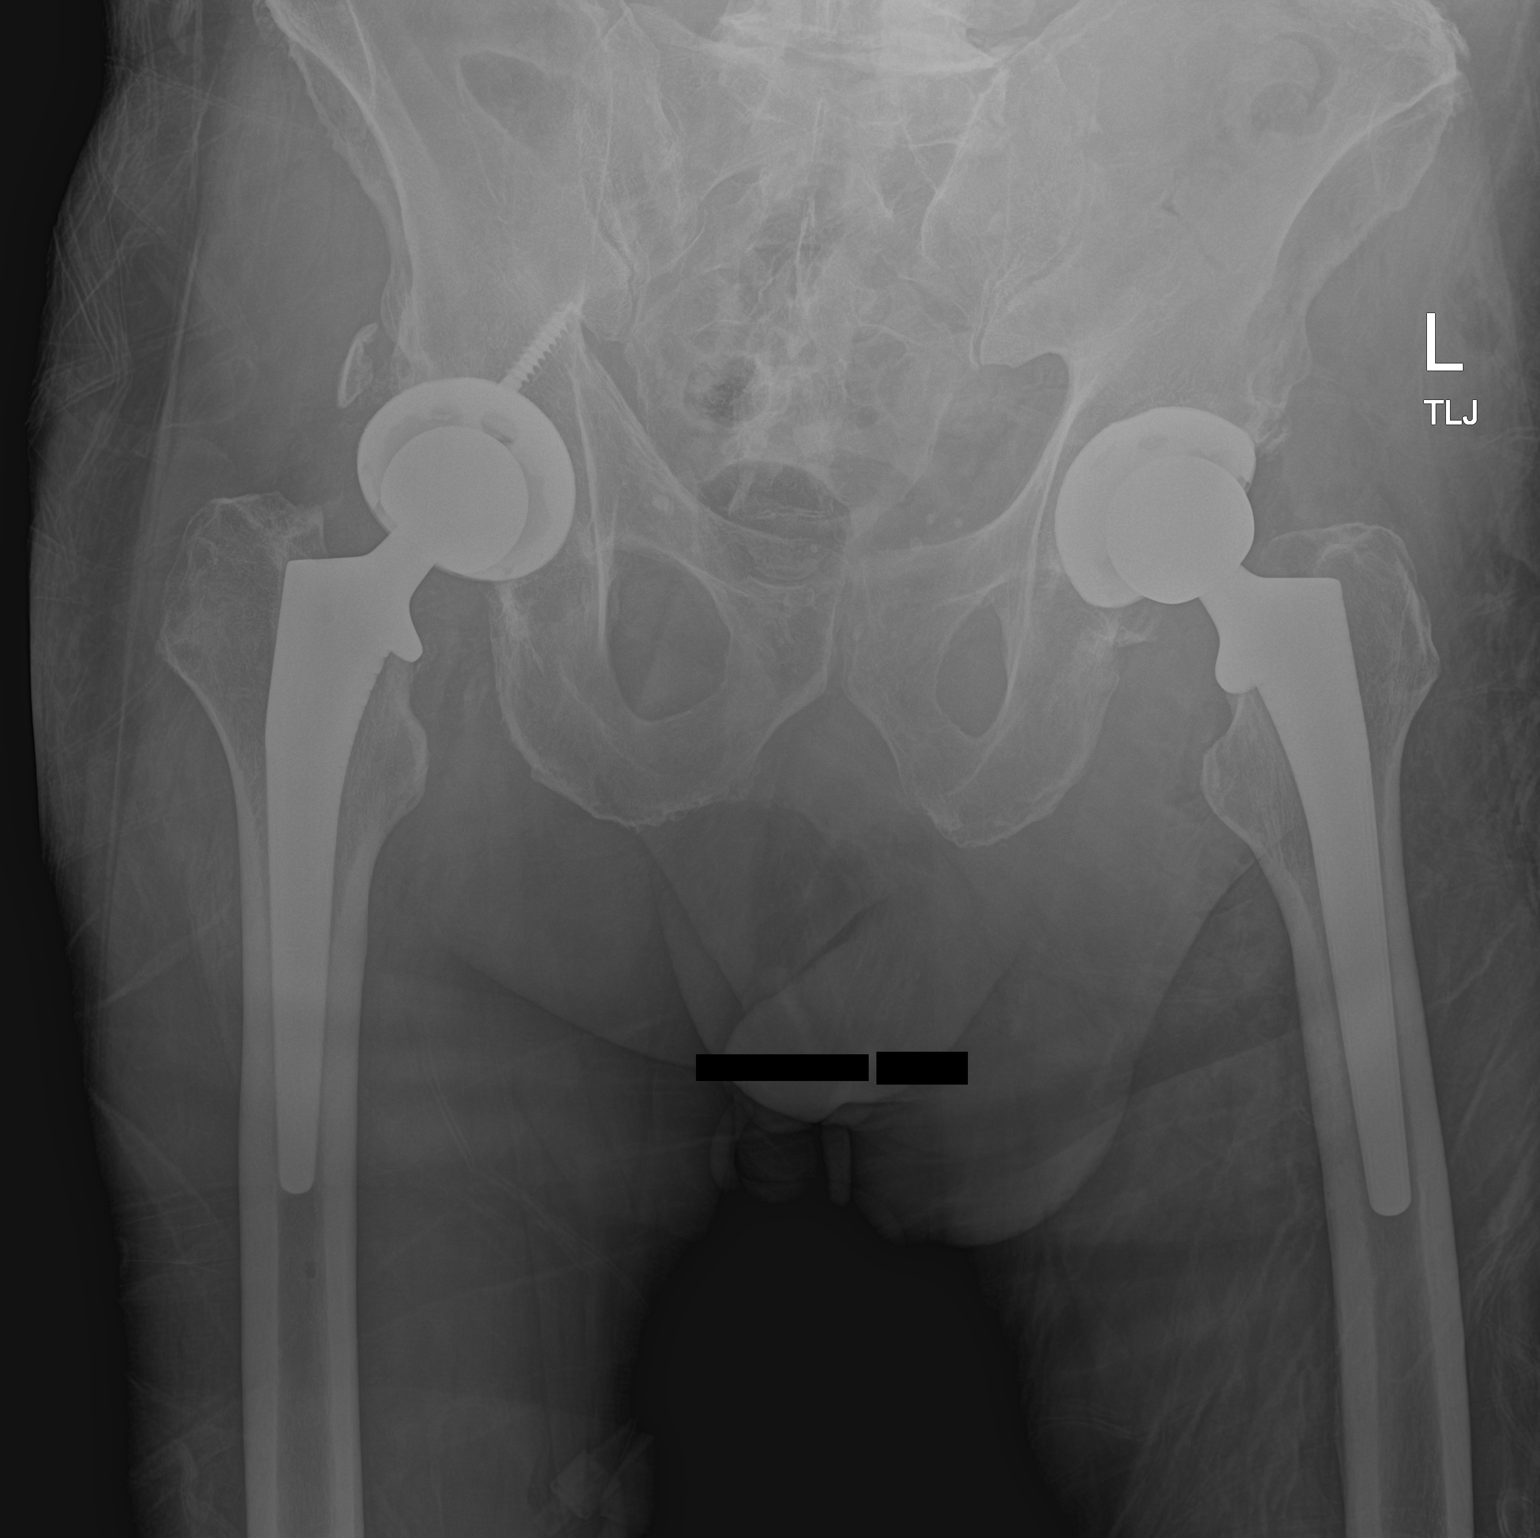

[1 of 1 positions shown; findings below may reference images not displayed]

FINDINGS: AP portable view of pelvis shows interval left hip arthroplasty.
There is previous right hip arthroplasty. No fracture is seen.
IMPRESSION: Interval left hip arthroplasty.

## 2024-01-25 DEATH — deceased
